# Patient Record
Sex: Female | Born: 2000 | Race: Black or African American | Hispanic: No | Marital: Single | Smoking: Former smoker
Health system: Southern US, Community
[De-identification: ages and names within clinical notes are randomized; demographics above are authoritative.]

## PROBLEM LIST (undated history)

## (undated) DIAGNOSIS — J45909 Unspecified asthma, uncomplicated: Secondary | ICD-10-CM

## (undated) DIAGNOSIS — N730 Acute parametritis and pelvic cellulitis: Secondary | ICD-10-CM

## (undated) DIAGNOSIS — Z789 Other specified health status: Secondary | ICD-10-CM

## (undated) HISTORY — DX: Unspecified asthma, uncomplicated: J45.909

## (undated) HISTORY — PX: NO PAST SURGERIES: SHX2092

---
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
## 2020-03-12 ENCOUNTER — Emergency Department (HOSPITAL_COMMUNITY)
Admission: EM | Admit: 2020-03-12 | Discharge: 2020-03-12 | Disposition: A | Discharge status: Home / Self Care | Payer: Medicaid Other | Attending: Emergency Medicine | Admitting: Emergency Medicine

## 2020-03-12 ENCOUNTER — Other Ambulatory Visit: Payer: Self-pay

## 2020-03-12 ENCOUNTER — Encounter (HOSPITAL_COMMUNITY): Payer: Self-pay | Admitting: *Deleted

## 2020-03-12 DIAGNOSIS — Z5321 Procedure and treatment not carried out due to patient leaving prior to being seen by health care provider: Secondary | ICD-10-CM | POA: Insufficient documentation

## 2020-03-12 DIAGNOSIS — R1084 Generalized abdominal pain: Secondary | ICD-10-CM | POA: Insufficient documentation

## 2020-03-12 DIAGNOSIS — K5900 Constipation, unspecified: Secondary | ICD-10-CM | POA: Insufficient documentation

## 2020-03-12 LAB — COMPREHENSIVE METABOLIC PANEL
ALT: 39 U/L (ref 0–44)
AST: 43 U/L — ABNORMAL HIGH (ref 15–41)
Albumin: 3.4 g/dL — ABNORMAL LOW (ref 3.5–5.0)
Alkaline Phosphatase: 74 U/L (ref 38–126)
Anion gap: 9 (ref 5–15)
BUN: 9 mg/dL (ref 6–20)
CO2: 24 mmol/L (ref 22–32)
Calcium: 9.2 mg/dL (ref 8.9–10.3)
Chloride: 106 mmol/L (ref 98–111)
Creatinine, Ser: 0.79 mg/dL (ref 0.44–1.00)
GFR calc Af Amer: >60 mL/min (ref >60)
GFR calc non Af Amer: >60 mL/min (ref >60)
Glucose, Bld: 101 mg/dL — ABNORMAL HIGH (ref 70–99)
Potassium: 4 mmol/L (ref 3.5–5.1)
Sodium: 139 mmol/L (ref 135–145)
Total Bilirubin: 0.3 mg/dL (ref 0.3–1.2)
Total Protein: 6.7 g/dL (ref 6.5–8.1)

## 2020-03-12 LAB — CBC
HCT: 36.8 % (ref 36.0–46.0)
Hemoglobin: 11.6 g/dL — ABNORMAL LOW (ref 12.0–15.0)
MCH: 28.2 pg (ref 26.0–34.0)
MCHC: 31.5 g/dL (ref 30.0–36.0)
MCV: 89.5 fL (ref 80.0–100.0)
Platelets: 293 10*3/uL (ref 150–400)
RBC: 4.11 MIL/uL (ref 3.87–5.11)
RDW: 13 % (ref 11.5–15.5)
WBC: 7.8 10*3/uL (ref 4.0–10.5)
nRBC: 0 % (ref 0.0–0.2)

## 2020-03-12 LAB — URINALYSIS, ROUTINE W REFLEX MICROSCOPIC
Bilirubin Urine: NEGATIVE
Glucose, UA: NEGATIVE mg/dL
Hgb urine dipstick: NEGATIVE
Ketones, ur: NEGATIVE mg/dL
Leukocytes,Ua: NEGATIVE
Nitrite: NEGATIVE
Protein, ur: NEGATIVE mg/dL
Specific Gravity, Urine: 1.02 (ref 1.005–1.030)
pH: 6 (ref 5.0–8.0)

## 2020-03-12 LAB — I-STAT BETA HCG BLOOD, ED (MC, WL, AP ONLY): I-stat hCG, quantitative: <5 m[IU]/mL (ref <5)

## 2020-03-12 LAB — LIPASE, BLOOD: Lipase: 24 U/L (ref 11–51)

## 2020-03-12 NOTE — ED Triage Notes (Signed)
Pt is here with one week of generalized abdominal and states some constipation.  Denies vomiting or diarrhea.  LMP 7/28. Pt states cycle is off and on.  No vaginal discharge or dysuria.

## 2020-03-14 ENCOUNTER — Inpatient Hospital Stay (HOSPITAL_COMMUNITY)
Admission: EM | Admit: 2020-03-14 | Discharge: 2020-03-17 | DRG: 759 | Disposition: A | Discharge status: Home / Self Care | Payer: Medicaid Other | Attending: Obstetrics and Gynecology | Admitting: Obstetrics and Gynecology

## 2020-03-14 ENCOUNTER — Encounter: Payer: Self-pay | Admitting: Obstetrics and Gynecology

## 2020-03-14 ENCOUNTER — Emergency Department (HOSPITAL_COMMUNITY): Payer: Medicaid Other

## 2020-03-14 ENCOUNTER — Other Ambulatory Visit: Payer: Self-pay

## 2020-03-14 DIAGNOSIS — Z79899 Other long term (current) drug therapy: Secondary | ICD-10-CM

## 2020-03-14 DIAGNOSIS — N83202 Unspecified ovarian cyst, left side: Secondary | ICD-10-CM | POA: Diagnosis present

## 2020-03-14 DIAGNOSIS — R109 Unspecified abdominal pain: Secondary | ICD-10-CM | POA: Diagnosis not present

## 2020-03-14 DIAGNOSIS — N739 Female pelvic inflammatory disease, unspecified: Principal | ICD-10-CM | POA: Diagnosis present

## 2020-03-14 DIAGNOSIS — Z791 Long term (current) use of non-steroidal anti-inflammatories (NSAID): Secondary | ICD-10-CM

## 2020-03-14 DIAGNOSIS — Z20822 Contact with and (suspected) exposure to covid-19: Secondary | ICD-10-CM | POA: Diagnosis present

## 2020-03-14 DIAGNOSIS — R102 Pelvic and perineal pain: Secondary | ICD-10-CM

## 2020-03-14 DIAGNOSIS — K529 Noninfective gastroenteritis and colitis, unspecified: Secondary | ICD-10-CM | POA: Diagnosis present

## 2020-03-14 LAB — COMPREHENSIVE METABOLIC PANEL
ALT: 23 U/L (ref 0–44)
AST: 16 U/L (ref 15–41)
Albumin: 3.3 g/dL — ABNORMAL LOW (ref 3.5–5.0)
Alkaline Phosphatase: 77 U/L (ref 38–126)
Anion gap: 11 (ref 5–15)
BUN: 7 mg/dL (ref 6–20)
CO2: 22 mmol/L (ref 22–32)
Calcium: 9.2 mg/dL (ref 8.9–10.3)
Chloride: 102 mmol/L (ref 98–111)
Creatinine, Ser: 0.7 mg/dL (ref 0.44–1.00)
GFR calc Af Amer: >60 mL/min (ref >60)
GFR calc non Af Amer: >60 mL/min (ref >60)
Glucose, Bld: 112 mg/dL — ABNORMAL HIGH (ref 70–99)
Potassium: 3.5 mmol/L (ref 3.5–5.1)
Sodium: 135 mmol/L (ref 135–145)
Total Bilirubin: 0.5 mg/dL (ref 0.3–1.2)
Total Protein: 7 g/dL (ref 6.5–8.1)

## 2020-03-14 LAB — URINALYSIS, ROUTINE W REFLEX MICROSCOPIC
Bacteria, UA: NONE SEEN
Bilirubin Urine: NEGATIVE
Glucose, UA: NEGATIVE mg/dL
Ketones, ur: NEGATIVE mg/dL
Leukocytes,Ua: NEGATIVE
Nitrite: NEGATIVE
Protein, ur: NEGATIVE mg/dL
Specific Gravity, Urine: 1.02 (ref 1.005–1.030)
pH: 5 (ref 5.0–8.0)

## 2020-03-14 LAB — CBC
HCT: 37.1 % (ref 36.0–46.0)
Hemoglobin: 12.2 g/dL (ref 12.0–15.0)
MCH: 28.7 pg (ref 26.0–34.0)
MCHC: 32.9 g/dL (ref 30.0–36.0)
MCV: 87.3 fL (ref 80.0–100.0)
Platelets: 346 10*3/uL (ref 150–400)
RBC: 4.25 MIL/uL (ref 3.87–5.11)
RDW: 13 % (ref 11.5–15.5)
WBC: 11.3 10*3/uL — ABNORMAL HIGH (ref 4.0–10.5)
nRBC: 0 % (ref 0.0–0.2)

## 2020-03-14 LAB — WET PREP, GENITAL
Clue Cells Wet Prep HPF POC: NONE SEEN
Sperm: NONE SEEN
Trich, Wet Prep: NONE SEEN
Yeast Wet Prep HPF POC: NONE SEEN

## 2020-03-14 LAB — I-STAT BETA HCG BLOOD, ED (MC, WL, AP ONLY): I-stat hCG, quantitative: <5 m[IU]/mL (ref <5)

## 2020-03-14 LAB — LIPASE, BLOOD: Lipase: 21 U/L (ref 11–51)

## 2020-03-14 MED ORDER — FENTANYL CITRATE (PF) 100 MCG/2ML IJ SOLN
25.0000 ug | Freq: Once | INTRAMUSCULAR | Status: AC
  Administered 2020-03-14: 25 ug via INTRAVENOUS
  Filled 2020-03-14: qty 2

## 2020-03-14 MED ORDER — DOXYCYCLINE HYCLATE 100 MG PO CAPS: 100.0000 mg | ORAL_CAPSULE | Freq: Two times a day (BID) | ORAL | 0 refills | Status: AC

## 2020-03-14 MED ORDER — IOHEXOL 300 MG/ML  SOLN
100.0000 mL | Freq: Once | INTRAMUSCULAR | Status: AC | PRN
  Administered 2020-03-14: 100 mL via INTRAVENOUS

## 2020-03-14 MED ORDER — IBUPROFEN 200 MG PO TABS: 800.0000 mg | ORAL_TABLET | Freq: Three times a day (TID) | ORAL | Status: DC | PRN

## 2020-03-14 MED ORDER — CEFTRIAXONE SODIUM 500 MG IJ SOLR
500.0000 mg | Freq: Once | INTRAMUSCULAR | Status: AC
  Administered 2020-03-14: 500 mg via INTRAMUSCULAR
  Filled 2020-03-14: qty 500

## 2020-03-14 MED ORDER — DOCUSATE SODIUM 100 MG PO CAPS
100.0000 mg | ORAL_CAPSULE | Freq: Two times a day (BID) | ORAL | Status: DC
  Administered 2020-03-15 – 2020-03-17 (×4): 100 mg via ORAL
  Filled 2020-03-14 (×5): qty 1

## 2020-03-14 MED ORDER — ZOLPIDEM TARTRATE 5 MG PO TABS: 5.0000 mg | ORAL_TABLET | Freq: Every evening | ORAL | Status: DC | PRN

## 2020-03-14 MED ORDER — ONDANSETRON HCL 4 MG PO TABS
4.0000 mg | ORAL_TABLET | Freq: Four times a day (QID) | ORAL | Status: DC | PRN
  Administered 2020-03-15: 4 mg via ORAL
  Filled 2020-03-14: qty 1

## 2020-03-14 MED ORDER — SODIUM CHLORIDE 0.9 % IV SOLN
2.0000 g | Freq: Four times a day (QID) | INTRAVENOUS | Status: DC
  Administered 2020-03-15 – 2020-03-17 (×11): 2 g via INTRAVENOUS
  Filled 2020-03-14 (×14): qty 2

## 2020-03-14 MED ORDER — DOXYCYCLINE HYCLATE 100 MG PO TABS
100.0000 mg | ORAL_TABLET | Freq: Two times a day (BID) | ORAL | Status: DC
  Administered 2020-03-15 – 2020-03-17 (×6): 100 mg via ORAL
  Filled 2020-03-14 (×6): qty 1

## 2020-03-14 MED ORDER — SODIUM CHLORIDE 0.9 % IV BOLUS
1000.0000 mL | Freq: Once | INTRAVENOUS | Status: AC
  Administered 2020-03-14: 1000 mL via INTRAVENOUS

## 2020-03-14 MED ORDER — KETOROLAC TROMETHAMINE 60 MG/2ML IM SOLN
60.0000 mg | Freq: Once | INTRAMUSCULAR | Status: DC
  Filled 2020-03-14: qty 2

## 2020-03-14 MED ORDER — DEXTROSE IN LACTATED RINGERS 5 % IV SOLN: INTRAVENOUS | Status: DC

## 2020-03-14 MED ORDER — OXYCODONE-ACETAMINOPHEN 5-325 MG PO TABS
1.0000 | ORAL_TABLET | ORAL | Status: DC | PRN
  Administered 2020-03-15 (×2): 2 via ORAL
  Administered 2020-03-15 – 2020-03-16 (×2): 1 via ORAL
  Filled 2020-03-14: qty 2
  Filled 2020-03-14: qty 1
  Filled 2020-03-14: qty 2
  Filled 2020-03-14: qty 1

## 2020-03-14 MED ORDER — ACETAMINOPHEN 325 MG PO TABS
650.0000 mg | ORAL_TABLET | Freq: Once | ORAL | Status: AC
  Administered 2020-03-14: 650 mg via ORAL
  Filled 2020-03-14: qty 2

## 2020-03-14 MED ORDER — DOXYCYCLINE HYCLATE 100 MG PO TABS
100.0000 mg | ORAL_TABLET | Freq: Once | ORAL | Status: AC
  Administered 2020-03-14: 100 mg via ORAL
  Filled 2020-03-14: qty 1

## 2020-03-14 MED ORDER — ONDANSETRON HCL 4 MG/2ML IJ SOLN
4.0000 mg | Freq: Four times a day (QID) | INTRAMUSCULAR | Status: DC | PRN
  Administered 2020-03-16: 4 mg via INTRAVENOUS
  Filled 2020-03-14: qty 2

## 2020-03-14 NOTE — ED Triage Notes (Signed)
Pt reports 2 weeks of generalized intermittent abdominal pain. Endorses nausea but denies vomiting or diarrhea. Reports spotting, although she is not supposed to get her period until the end of the month.

## 2020-03-14 NOTE — H&P (Signed)
[facility] Attending History and Physical  Whitney Hickman is a 19 y.o. G1P1001 SVD x 1 who presented to the ED today for evaluation of abdominal pain which she has had for 2 weeks.  Pt states the pain is sharp and continuous.  She denies nausea and vomiting.  Positive chills are noted.  Pt had a tmax of 102 while in the ED.  She also denies dysuria, constipation or diarrhea.  Pt denies history of gonorrhea or chlamydia.  Of note pt states she generally has unprotected intercourse with her partner.    Past Medical History:  Diagnosis Date  . Medical history non-contributory    Past Surgical History:  Procedure Laterality Date  . NO PAST SURGERIES     OB History  Gravida Para Term Preterm AB Living  1 1 1     1   SAB TAB Ectopic Multiple Live Births        0 1    # Outcome Date GA Lbr Len/2nd Weight Sex Delivery Anes PTL Lv  1 Term 11/25/16 [redacted]w[redacted]d 05:47 / 00:50 2741 g F Vag-Spont EPI  LIV  Patient denies any other pertinent gynecologic issues.  No current facility-administered medications on file prior to encounter.   Current Outpatient Medications on File Prior to Encounter  Medication Sig Dispense Refill  . ibuprofen (ADVIL,MOTRIN) 600 MG tablet Take 1 tablet (600 mg total) by mouth every 6 (six) hours. 30 tablet 0  . Prenatal Vit-Fe Fumarate-FA (PREPLUS) 27-1 MG TABS Take 1 tablet by mouth daily.  1   No Known Allergies  Social History:   reports that she has never smoked. She has never used smokeless tobacco. She reports previous alcohol use. She reports previous drug use. No family history on file.  Review of Systems: Pertinent items noted in HPI and remainder of comprehensive ROS otherwise negative.  PHYSICAL EXAM: Blood pressure (!) 100/53, pulse 96, temperature (!) 102 F (38.9 C), temperature source Oral, resp. rate 16, height 5\' 3"  (1.6 m), weight 54.4 kg, last menstrual period 02/21/2020, SpO2 98 %, unknown if currently  breastfeeding. CONSTITUTIONAL: Well-developed, well-nourished female in no acute distress.  HENT:  Normocephalic, atraumatic, External right and left ear normal. Oropharynx is clear and moist EYES: Conjunctivae and EOM are normal. No scleral icterus.  NECK: Normal range of motion, supple, no masses SKIN: Skin is warm and dry. No rash noted. Not diaphoretic. No erythema. No pallor. NEUROLOGIC: Alert and oriented to person, place, and time. Normal reflexes, muscle tone coordination. No cranial nerve deficit noted. PSYCHIATRIC: Normal mood and affect. Normal behavior. Normal judgment and thought content. CARDIOVASCULAR: Normal heart rate noted, regular rhythm RESPIRATORY: Effort and breath sounds normal, no problems with respiration noted ABDOMEN:tender in all 4 quadrants.  Pain is the worst in the LLQ.  Some guarding noted. PELVIC: Normal appearing external genitalia; normal appearing vaginal mucosa and cervix.  Tampon removed.    Normal appearing discharge.  Normal uterine size, no other palpable masses, cervical motion tenderness noted.  left adnexal pain noted. MUSCULOSKELETAL: Normal range of motion. No tenderness.  No cyanosis, clubbing, or edema.  2+ distal pulses.  Labs: Results for orders placed or performed during the hospital encounter of 03/14/20 (from the past 336 hour(s))  Lipase, blood   Collection Time: 03/14/20  1:28 PM  Result Value Ref Range   Lipase 21 11 - 51 U/L  Comprehensive metabolic panel   Collection Time: 03/14/20  1:28 PM  Result Value Ref Range  Sodium 135 135 - 145 mmol/L   Potassium 3.5 3.5 - 5.1 mmol/L   Chloride 102 98 - 111 mmol/L   CO2 22 22 - 32 mmol/L   Glucose, Bld 112 (H) 70 - 99 mg/dL   BUN 7 6 - 20 mg/dL   Creatinine, Ser 2.42 0.44 - 1.00 mg/dL   Calcium 9.2 8.9 - 35.3 mg/dL   Total Protein 7.0 6.5 - 8.1 g/dL   Albumin 3.3 (L) 3.5 - 5.0 g/dL   AST 16 15 - 41 U/L   ALT 23 0 - 44 U/L   Alkaline Phosphatase 77 38 - 126 U/L   Total Bilirubin 0.5  0.3 - 1.2 mg/dL   GFR calc non Af Amer >60 >60 mL/min   GFR calc Af Amer >60 >60 mL/min   Anion gap 11 5 - 15  CBC   Collection Time: 03/14/20  1:28 PM  Result Value Ref Range   WBC 11.3 (H) 4.0 - 10.5 K/uL   RBC 4.25 3.87 - 5.11 MIL/uL   Hemoglobin 12.2 12.0 - 15.0 g/dL   HCT 61.4 36 - 46 %   MCV 87.3 80.0 - 100.0 fL   MCH 28.7 26.0 - 34.0 pg   MCHC 32.9 30.0 - 36.0 g/dL   RDW 43.1 54.0 - 08.6 %   Platelets 346 150 - 400 K/uL   nRBC 0.0 0.0 - 0.2 %  I-Stat beta hCG blood, ED   Collection Time: 03/14/20  2:03 PM  Result Value Ref Range   I-stat hCG, quantitative <5.0 <5 mIU/mL   Comment 3          Urinalysis, Routine w reflex microscopic Urine, Clean Catch   Collection Time: 03/14/20  4:00 PM  Result Value Ref Range   Color, Urine YELLOW YELLOW   APPearance CLEAR CLEAR   Specific Gravity, Urine 1.020 1.005 - 1.030   pH 5.0 5.0 - 8.0   Glucose, UA NEGATIVE NEGATIVE mg/dL   Hgb urine dipstick SMALL (A) NEGATIVE   Bilirubin Urine NEGATIVE NEGATIVE   Ketones, ur NEGATIVE NEGATIVE mg/dL   Protein, ur NEGATIVE NEGATIVE mg/dL   Nitrite NEGATIVE NEGATIVE   Leukocytes,Ua NEGATIVE NEGATIVE   RBC / HPF 6-10 0 - 5 RBC/hpf   WBC, UA 0-5 0 - 5 WBC/hpf   Bacteria, UA NONE SEEN NONE SEEN   Squamous Epithelial / LPF 0-5 0 - 5   Mucus PRESENT   Wet prep, genital   Collection Time: 03/14/20  4:45 PM   Specimen: Vaginal  Result Value Ref Range   Yeast Wet Prep HPF POC NONE SEEN NONE SEEN   Trich, Wet Prep NONE SEEN NONE SEEN   Clue Cells Wet Prep HPF POC NONE SEEN NONE SEEN   WBC, Wet Prep HPF POC MANY (A) NONE SEEN   Sperm NONE SEEN   Results for orders placed or performed during the hospital encounter of 03/12/20 (from the past 336 hour(s))  Lipase, blood   Collection Time: 03/12/20  7:30 AM  Result Value Ref Range   Lipase 24 11 - 51 U/L  Comprehensive metabolic panel   Collection Time: 03/12/20  7:30 AM  Result Value Ref Range   Sodium 139 135 - 145 mmol/L   Potassium 4.0  3.5 - 5.1 mmol/L   Chloride 106 98 - 111 mmol/L   CO2 24 22 - 32 mmol/L   Glucose, Bld 101 (H) 70 - 99 mg/dL   BUN 9 6 - 20 mg/dL   Creatinine, Ser  0.79 0.44 - 1.00 mg/dL   Calcium 9.2 8.9 - 49.8 mg/dL   Total Protein 6.7 6.5 - 8.1 g/dL   Albumin 3.4 (L) 3.5 - 5.0 g/dL   AST 43 (H) 15 - 41 U/L   ALT 39 0 - 44 U/L   Alkaline Phosphatase 74 38 - 126 U/L   Total Bilirubin 0.3 0.3 - 1.2 mg/dL   GFR calc non Af Amer >60 >60 mL/min   GFR calc Af Amer >60 >60 mL/min   Anion gap 9 5 - 15  CBC   Collection Time: 03/12/20  7:30 AM  Result Value Ref Range   WBC 7.8 4.0 - 10.5 K/uL   RBC 4.11 3.87 - 5.11 MIL/uL   Hemoglobin 11.6 (L) 12.0 - 15.0 g/dL   HCT 26.4 36 - 46 %   MCV 89.5 80.0 - 100.0 fL   MCH 28.2 26.0 - 34.0 pg   MCHC 31.5 30.0 - 36.0 g/dL   RDW 15.8 30.9 - 40.7 %   Platelets 293 150 - 400 K/uL   nRBC 0.0 0.0 - 0.2 %  I-Stat beta hCG blood, ED   Collection Time: 03/12/20  7:39 AM  Result Value Ref Range   I-stat hCG, quantitative <5.0 <5 mIU/mL   Comment 3          Urinalysis, Routine w reflex microscopic Urine, Clean Catch   Collection Time: 03/12/20  7:46 AM  Result Value Ref Range   Color, Urine YELLOW YELLOW   APPearance CLEAR CLEAR   Specific Gravity, Urine 1.020 1.005 - 1.030   pH 6.0 5.0 - 8.0   Glucose, UA NEGATIVE NEGATIVE mg/dL   Hgb urine dipstick NEGATIVE NEGATIVE   Bilirubin Urine NEGATIVE NEGATIVE   Ketones, ur NEGATIVE NEGATIVE mg/dL   Protein, ur NEGATIVE NEGATIVE mg/dL   Nitrite NEGATIVE NEGATIVE   Leukocytes,Ua NEGATIVE NEGATIVE    Imaging Studies: CT ABDOMEN PELVIS W CONTRAST  Result Date: 03/14/2020 CLINICAL DATA:  19 year old female with abdominal pain. EXAM: CT ABDOMEN AND PELVIS WITH CONTRAST TECHNIQUE: Multidetector CT imaging of the abdomen and pelvis was performed using the standard protocol following bolus administration of intravenous contrast. CONTRAST:  OMNIPAQUE IOHEXOL 300 MG/ML  SOLN COMPARISON:  Pelvic ultrasound dated  03/14/2020. FINDINGS: Lower chest: The visualized lung bases are clear. No intra-abdominal free air or free fluid. Hepatobiliary: No focal liver abnormality is seen. No gallstones, gallbladder wall thickening, or biliary dilatation. Pancreas: Unremarkable. No pancreatic ductal dilatation or surrounding inflammatory changes. Spleen: Normal in size without focal abnormality. Adrenals/Urinary Tract: The adrenal glands are unremarkable. The kidneys, and visualized ureters appear unremarkable. The urinary bladder is collapsed. Stomach/Bowel: There is diffuse mucosal thickening and enhancement of the small bowel consistent with enteritis. Clinical correlation is recommended. There is no bowel obstruction. The appendix is normal. Vascular/Lymphatic: The abdominal aorta and IVC are unremarkable. No portal venous gas. There is no adenopathy. Reproductive: The uterus is anteverted and grossly unremarkable. There is a 3 cm left ovarian corpus luteum. There is an enhancing tissue in the right hemipelvis anteriorly, likely vascular pedicle of the right ovary. Other: There is diffuse pelvic edema. Musculoskeletal: No acute or significant osseous findings. IMPRESSION: 1. Enteritis. Clinical correlation is recommended. No bowel obstruction. Normal appendix. 2. A 3 cm left ovarian corpus luteum. Electronically Signed   By: Elgie Collard M.D.   On: 03/14/2020 19:25   US PELVIC COMPLETE W TRANSVAGINAL AND TORSION R/O  Result Date: 03/14/2020 CLINICAL DATA:  Adnexal pain. EXAM: TRANSABDOMINAL  AND TRANSVAGINAL ULTRASOUND OF PELVIS DOPPLER ULTRASOUND OF OVARIES TECHNIQUE: Both transabdominal and transvaginal ultrasound examinations of the pelvis were performed. Transabdominal technique was performed for global imaging of the pelvis including uterus, ovaries, adnexal regions, and pelvic cul-de-sac. It was necessary to proceed with endovaginal exam following the transabdominal exam to visualize the ovaries and adnexa. Color and  duplex Doppler ultrasound was utilized to evaluate blood flow to the ovaries. COMPARISON:  Concurrent abdominal CT, available at time of ultrasound assessment FINDINGS: Uterus Measurements: 8.8 x 4.3 x 5.1 cm = volume: 101 mL. No fibroids or other mass visualized. Endometrium Thickness: 10 mm, normal.  No focal abnormality visualized. Right ovary Measurements: 4.8 x 2.5 x 2.9 cm = volume: 18.1 mL. Multiple follicles. Arterial and venous flow is seen with question of ovarian hypervascularity. No adnexal mass. Left ovary Measurements: 5.5 x 3.6 x 4.7 cm = volume: 48 mL. There is increased ovarian blood flow. Heterogeneous complex area in the left ovary measuring approximately 3.6 x 3.3 x 3.2 cm has peripheral but no internal flow. Patient was exquisitely tender in the left lower quadrant which limited assessment. Pulsed Doppler evaluation of both ovaries demonstrates normal low-resistance arterial and venous waveforms. Other findings Small to moderate volume of complex heterogeneous free fluid in the pelvis. IMPRESSION: 1. Slight increase bilateral adnexal/ovarian blood flow with complex free fluid in the pelvis. Differential considerations include pelvic inflammatory disease or pelvic congestion syndrome with a concurrent hemorrhagic cyst on the left, described below. 2. Complex heterogeneous 3.6 x 3.3 x 3.2 cm structure in the left ovary with peripheral but no internal vascularity. This may represent a hemorrhagic cyst. The possibility of solid lesion or less likely tubo-ovarian abscess are considered. Short-interval follow up ultrasound in 6-12 weeks is recommended, preferably during the week following the patient's normal menses. 3. Normal sonographic appearance of the uterus. 4. No ovarian torsion. Electronically Signed   By: Narda RutherfordMelanie  Sanford M.D.   On: 03/14/2020 19:27    Assessment: Active Problems:   Pelvic inflammatory disease   Pelvic inflammatory disease (PID)   Plan: Admit to [facility] Continue oral doxycycline BID, IV cefoxitin q 6 hours. Diet as tolerated.  Recheck labs in am Anticipate 24-48 hour hospital stay until pt afebrile 24 hours and pelvic pain improvement Routine floor care    Mariel AloeLawrence Anjolina Byrer, MD, FACOG Obstetrician & Gynecologist, [facility], [facility]

## 2020-03-14 NOTE — ED Notes (Signed)
Admit md states that pt can eat as tolerated. Pt given a bag lunch. Tolerated well.

## 2020-03-14 NOTE — ED Notes (Signed)
OB/GYN md to see pt. Pelvic exam done by md.

## 2020-03-14 NOTE — ED Provider Notes (Signed)
[facility] Provider Note   CSN: [id number] Arrival date & time: 03/14/20  1214     History Chief Complaint  Patient presents with  . Abdominal Pain    Wynelle LinkJamaya Hickman is a 19 y.o. female.  HPI 19 year old female with no significant medical history presents to the ER for 2 weeks of intermittent generalized abdominal pain, cramping, started period 2 weeks early.  He cannot localize the abdominal pain, states that when she presses on her legs that she will feel pain in the other.  She states that she has been having some painful bowel movements, however stool has not been hard.  Denies any hematochezia.  She states that she is sexually active with one partner and they do not use the barrier method.  Patient states her periods are pretty regular, however around the time of this abdominal pain started she also started to have some spotting and early evidence of a period. She will sometimes get nauseous but denies any vomiting. Endorses chills but no measurable fevers. No cough, SOB, cp.  Reports good appetite. Denies any vaginal discharge or odors.      Past Medical History:  Diagnosis Date  . Medical history non-contributory     Patient Active Problem List   Diagnosis Date Noted  . Pelvic inflammatory disease 03/14/2020  . Amniotic fluid leaking 11/24/2016    Past Surgical History:  Procedure Laterality Date  . NO PAST SURGERIES       OB History    Gravida  1   Para  1   Term  1   Preterm      AB      Living  1     SAB      TAB      Ectopic      Multiple  0   Live Births  1           No family history on file.  Social History   Tobacco Use  . Smoking status: Never Smoker  . Smokeless tobacco: Never Used  Substance Use Topics  . Alcohol use: Not Currently  . Drug use: Not Currently    Home Medications Prior to Admission medications   Medication Sig Start Date End Date Taking? Authorizing Provider    doxycycline (VIBRAMYCIN) 100 MG capsule Take 1 capsule (100 mg total) by mouth 2 (two) times daily for 14 days. 03/14/20 03/28/20  Mare FerrariBelaya, Sorin Frimpong A, PA-C  ibuprofen (ADVIL,MOTRIN) 600 MG tablet Take 1 tablet (600 mg total) by mouth every 6 (six) hours. 11/27/16   Aviva SignsWilliams, Marie L, CNM  Prenatal Vit-Fe Fumarate-FA (PREPLUS) 27-1 MG TABS Take 1 tablet by mouth daily. 10/25/16   [provider]    Allergies    Patient has no known allergies.  Review of Systems   Review of Systems  Constitutional: Negative for chills and fever.  HENT: Negative for ear pain and sore throat.   Eyes: Negative for pain and visual disturbance.  Respiratory: Negative for cough and shortness of breath.   Cardiovascular: Negative for chest pain and palpitations.  Gastrointestinal: Positive for abdominal pain and nausea. Negative for vomiting.  Genitourinary: Positive for pelvic pain and vaginal bleeding. Negative for dysuria, hematuria and vaginal discharge.  Musculoskeletal: Negative for arthralgias and back pain.  Skin: Negative for color change and rash.  Neurological: Negative for seizures, syncope and headaches.  All other systems reviewed and are negative.   Physical Exam Updated Vital Signs BP Marland Kitchen(!)  100/53 (BP Location: Right Arm)   Pulse 96   Temp (!) 102 F (38.9 C) (Oral)   Resp 16   Ht 5\' 3"  (1.6 m)   Wt 54.4 kg   LMP 02/21/2020 (Approximate)   SpO2 98%   BMI 21.26 kg/m   Physical Exam Vitals and nursing note reviewed. Exam conducted with a chaperone present 02/23/2020 RN).  Constitutional:      General: She is not in acute distress.    Appearance: She is well-developed. She is not ill-appearing, toxic-appearing or diaphoretic.  HENT:     Head: Normocephalic and atraumatic.  Eyes:     Conjunctiva/sclera: Conjunctivae normal.  Cardiovascular:     Rate and Rhythm: Normal rate and regular rhythm.     Heart sounds: No murmur heard.   Pulmonary:     Effort: Pulmonary effort is  normal. No respiratory distress.     Breath sounds: Normal breath sounds.  Abdominal:     Palpations: Abdomen is soft.     Tenderness: There is generalized abdominal tenderness.     Comments: Patient is very hyperesthetic to touch, barely allowing me to palpate her abdomen stating that she will feel extreme pain upon palpation  Genitourinary:    General: Normal vulva.     Exam position: Supine.     Vagina: Bleeding present.     Cervix: Cervical motion tenderness and cervical bleeding present. No discharge, friability or erythema.     Uterus: Normal.      Adnexa:        Right: Tenderness present.        Left: Tenderness present.      Rectum: No external hemorrhoid.     Comments: All quadrant blood in the vaginal vault. Positive CMT and bilateral adnexal tenderness Musculoskeletal:     Cervical back: Neck supple.  Skin:    General: Skin is warm and dry.  Neurological:     Mental Status: She is alert.     ED Results / Procedures / Treatments   Labs (all labs ordered are listed, but only abnormal results are displayed) Labs Reviewed  WET PREP, GENITAL - Abnormal; Notable for the following components:      Result Value   WBC, Wet Prep HPF POC MANY (*)    All other components within normal limits  COMPREHENSIVE METABOLIC PANEL - Abnormal; Notable for the following components:   Glucose, Bld 112 (*)    Albumin 3.3 (*)    All other components within normal limits  CBC - Abnormal; Notable for the following components:   WBC 11.3 (*)    All other components within normal limits  URINALYSIS, ROUTINE W REFLEX MICROSCOPIC - Abnormal; Notable for the following components:   Hgb urine dipstick SMALL (*)    All other components within normal limits  SARS CORONAVIRUS 2 BY RT PCR (HOSPITAL ORDER, PERFORMED IN [facility])  LIPASE, BLOOD  I-STAT BETA HCG BLOOD, ED ([facility], [facility], [facility] ONLY)  GC/CHLAMYDIA PROBE AMP ([place]) NOT AT [facility]    EKG None  Radiology CT ABDOMEN  PELVIS W CONTRAST  Result Date: 03/14/2020 CLINICAL DATA:  19 year old female with abdominal pain. EXAM: CT ABDOMEN AND PELVIS WITH CONTRAST TECHNIQUE: Multidetector CT imaging of the abdomen and pelvis was performed using the standard protocol following bolus administration of intravenous contrast. CONTRAST:  12 OMNIPAQUE IOHEXOL 300 MG/ML  SOLN COMPARISON:  Pelvic ultrasound dated 03/14/2020. FINDINGS: Lower chest: The visualized lung bases are clear. No intra-abdominal free  air or free fluid. Hepatobiliary: No focal liver abnormality is seen. No gallstones, gallbladder wall thickening, or biliary dilatation. Pancreas: Unremarkable. No pancreatic ductal dilatation or surrounding inflammatory changes. Spleen: Normal in size without focal abnormality. Adrenals/Urinary Tract: The adrenal glands are unremarkable. The kidneys, and visualized ureters appear unremarkable. The urinary bladder is collapsed. Stomach/Bowel: There is diffuse mucosal thickening and enhancement of the small bowel consistent with enteritis. Clinical correlation is recommended. There is no bowel obstruction. The appendix is normal. Vascular/Lymphatic: The abdominal aorta and IVC are unremarkable. No portal venous gas. There is no adenopathy. Reproductive: The uterus is anteverted and grossly unremarkable. There is a 3 cm left ovarian corpus luteum. There is an enhancing tissue in the right hemipelvis anteriorly, likely vascular pedicle of the right ovary. Other: There is diffuse pelvic edema. Musculoskeletal: No acute or significant osseous findings. IMPRESSION: 1. Enteritis. Clinical correlation is recommended. No bowel obstruction. Normal appendix. 2. A 3 cm left ovarian corpus luteum. Electronically Signed   By: Elgie Collard M.D.   On: 03/14/2020 19:25   US PELVIC COMPLETE W TRANSVAGINAL AND TORSION R/O  Result Date: 03/14/2020 CLINICAL DATA:  Adnexal pain. EXAM: TRANSABDOMINAL AND TRANSVAGINAL ULTRASOUND OF PELVIS DOPPLER  ULTRASOUND OF OVARIES TECHNIQUE: Both transabdominal and transvaginal ultrasound examinations of the pelvis were performed. Transabdominal technique was performed for global imaging of the pelvis including uterus, ovaries, adnexal regions, and pelvic cul-de-sac. It was necessary to proceed with endovaginal exam following the transabdominal exam to visualize the ovaries and adnexa. Color and duplex Doppler ultrasound was utilized to evaluate blood flow to the ovaries. COMPARISON:  Concurrent abdominal CT, available at time of ultrasound assessment FINDINGS: Uterus Measurements: 8.8 x 4.3 x 5.1 cm = volume: 101 mL. No fibroids or other mass visualized. Endometrium Thickness: 10 mm, normal.  No focal abnormality visualized. Right ovary Measurements: 4.8 x 2.5 x 2.9 cm = volume: 18.1 mL. Multiple follicles. Arterial and venous flow is seen with question of ovarian hypervascularity. No adnexal mass. Left ovary Measurements: 5.5 x 3.6 x 4.7 cm = volume: 48 mL. There is increased ovarian blood flow. Heterogeneous complex area in the left ovary measuring approximately 3.6 x 3.3 x 3.2 cm has peripheral but no internal flow. Patient was exquisitely tender in the left lower quadrant which limited assessment. Pulsed Doppler evaluation of both ovaries demonstrates normal low-resistance arterial and venous waveforms. Other findings Small to moderate volume of complex heterogeneous free fluid in the pelvis. IMPRESSION: 1. Slight increase bilateral adnexal/ovarian blood flow with complex free fluid in the pelvis. Differential considerations include pelvic inflammatory disease or pelvic congestion syndrome with a concurrent hemorrhagic cyst on the left, described below. 2. Complex heterogeneous 3.6 x 3.3 x 3.2 cm structure in the left ovary with peripheral but no internal vascularity. This may represent a hemorrhagic cyst. The possibility of solid lesion or less likely tubo-ovarian abscess are considered. Short-interval follow up  ultrasound in 6-12 weeks is recommended, preferably during the week following the patient's normal menses. 3. Normal sonographic appearance of the uterus. 4. No ovarian torsion. Electronically Signed   By: Narda Rutherford M.D.   On: 03/14/2020 19:27    Procedures Procedures (including critical care time)  Medications Ordered in ED Medications  ketorolac (TORADOL) injection 60 mg (60 mg Intramuscular Refused 03/14/20 1713)  acetaminophen (TYLENOL) tablet 650 mg (has no administration in time range)  sodium chloride 0.9 % bolus 1,000 mL (has no administration in time range)  fentaNYL (SUBLIMAZE) injection 25 mcg (has no administration  in time range)  cefTRIAXone (ROCEPHIN) injection 500 mg (500 mg Intramuscular Given 03/14/20 1716)  doxycycline (VIBRA-TABS) tablet 100 mg (100 mg Oral Given 03/14/20 1716)  iohexol (OMNIPAQUE) 300 MG/ML solution 100 mL (100 mLs Intravenous Contrast Given 03/14/20 1903)    ED Course  I have reviewed the triage vital signs and the nursing notes.  Pertinent labs & imaging results that were available during my care of the patient were reviewed by me and considered in my medical decision making (see chart for details).  Clinical Course as of Mar 14 2056  Thu Mar 14, 2020  2017 Donavan Foil    [MB]    Clinical Course User Index [MB] Leone Brand   MDM Rules/Calculators/A&P                         38:39 PM: 19 year old female with intermittent abdominal pain for the last 2 weeks On presentation, she is alert, oriented, nontoxic-appearing, in  no acute distress, resting comfortably in the ER bed.  Vitals with a borderline fever at 100, other vitals reassuring.  Physical exam with the patient being extremely hyperesthetic to touch, pain with any touch her abdomen stating that she will feel severe pain if I palpate.  DDX includes constipation, SBO, appendicits,  STD/PID, ovarian torsion, gallstones, PID, tubo-ovarian abscess, UTI, IBS   I personally reviewed and  interpreted her lab work  CMP without any significant electrode abnormalities, normal renal and liver function test.   CBC with mild leukocytosis of 11.3.  Normal hemoglobin. UA clear evidence of UTI, with small hemoglobin. Lipase normal Beta-hCG negative  Given the patient is significantly hyperesthetic to touch and is limiting my physical exam, will order CT abdomen pelvis.  We will also perform pelvic exam.  Will order Toradol for pain.  5:05PM:   Pelvic exam positive for CMT and bilateral adnexal tenderness. Given this, will order transvaginal ultrasound to rule out torsion and treat for PID with Rocephin and Doxy. Wet prep with many WBCs, negative for trich, clue cells or yeast. G/C chlamydia pending.   8:27 PM: Patient again febrile with a temperature of 102.  Treated with Tylenol.  Patient refused shot of Toradol as she had already gotten a shot of Rocephin and did not want another shot.  IV fentanyl was ordered. Started IV fluids. Her pelvic ultrasound showed increase in bilateral adnexal ovarian blood flow with complex free fluid in the pelvis suggestive of PID or pelvic congestion syndrome with concurrent hemorrhagic cyst on the left ( less likely tubo-ovarian abscess). CT abdomen with enteritis but no other acute intra-abdominal pathology. Pt does not endorse any vomiting. Pt continues to have pain on exam. Consulted Dr. Donavan Foil with OBGYN who recommends the patient be brought in for IV antibiotics.   DISPOSITION: Pt will be admitted for IV antibiotics for PID. Dr. Donavan Foil following. Remains hemodynamically stable.    Final Clinical Impression(s) / ED Diagnoses Final diagnoses:  Adnexal pain    Rx / DC Orders    Mare Ferrari, PA-C 03/14/20 2057    Pricilla Loveless, MD 03/15/20 1504

## 2020-03-15 ENCOUNTER — Other Ambulatory Visit: Payer: Self-pay

## 2020-03-15 ENCOUNTER — Encounter (HOSPITAL_COMMUNITY): Payer: Self-pay | Admitting: Obstetrics and Gynecology

## 2020-03-15 DIAGNOSIS — N739 Female pelvic inflammatory disease, unspecified: Principal | ICD-10-CM

## 2020-03-15 LAB — COMPREHENSIVE METABOLIC PANEL
ALT: 19 U/L (ref 0–44)
AST: 22 U/L (ref 15–41)
Albumin: 2.6 g/dL — ABNORMAL LOW (ref 3.5–5.0)
Alkaline Phosphatase: 65 U/L (ref 38–126)
Anion gap: 6 (ref 5–15)
BUN: <5 mg/dL — ABNORMAL LOW (ref 6–20)
CO2: 24 mmol/L (ref 22–32)
Calcium: 8.3 mg/dL — ABNORMAL LOW (ref 8.9–10.3)
Chloride: 104 mmol/L (ref 98–111)
Creatinine, Ser: 0.71 mg/dL (ref 0.44–1.00)
GFR calc Af Amer: >60 mL/min (ref >60)
GFR calc non Af Amer: >60 mL/min (ref >60)
Glucose, Bld: 142 mg/dL — ABNORMAL HIGH (ref 70–99)
Potassium: 3.1 mmol/L — ABNORMAL LOW (ref 3.5–5.1)
Sodium: 134 mmol/L — ABNORMAL LOW (ref 135–145)
Total Bilirubin: 0.4 mg/dL (ref 0.3–1.2)
Total Protein: 5.8 g/dL — ABNORMAL LOW (ref 6.5–8.1)

## 2020-03-15 LAB — CBC WITH DIFFERENTIAL/PLATELET
Abs Immature Granulocytes: 0.04 10*3/uL (ref 0.00–0.07)
Basophils Absolute: 0 10*3/uL (ref 0.0–0.1)
Basophils Relative: 0 %
Eosinophils Absolute: 0 10*3/uL (ref 0.0–0.5)
Eosinophils Relative: 0 %
HCT: 31.1 % — ABNORMAL LOW (ref 36.0–46.0)
Hemoglobin: 10 g/dL — ABNORMAL LOW (ref 12.0–15.0)
Immature Granulocytes: 1 %
Lymphocytes Relative: 17 %
Lymphs Abs: 1.4 10*3/uL (ref 0.7–4.0)
MCH: 28 pg (ref 26.0–34.0)
MCHC: 32.2 g/dL (ref 30.0–36.0)
MCV: 87.1 fL (ref 80.0–100.0)
Monocytes Absolute: 0.9 10*3/uL (ref 0.1–1.0)
Monocytes Relative: 11 %
Neutro Abs: 5.9 10*3/uL (ref 1.7–7.7)
Neutrophils Relative %: 71 %
Platelets: 290 10*3/uL (ref 150–400)
RBC: 3.57 MIL/uL — ABNORMAL LOW (ref 3.87–5.11)
RDW: 12.8 % (ref 11.5–15.5)
WBC: 8.3 10*3/uL (ref 4.0–10.5)
nRBC: 0 % (ref 0.0–0.2)

## 2020-03-15 LAB — GC/CHLAMYDIA PROBE AMP (~~LOC~~) NOT AT ARMC
Chlamydia: POSITIVE — AB
Comment: NEGATIVE
Comment: NORMAL
Neisseria Gonorrhea: NEGATIVE

## 2020-03-15 LAB — SARS CORONAVIRUS 2 BY RT PCR (HOSPITAL ORDER, PERFORMED IN ~~LOC~~ HOSPITAL LAB): SARS Coronavirus 2: NEGATIVE

## 2020-03-15 MED ORDER — POTASSIUM CHLORIDE CRYS ER 20 MEQ PO TBCR
20.0000 meq | EXTENDED_RELEASE_TABLET | Freq: Two times a day (BID) | ORAL | Status: AC
  Administered 2020-03-15 – 2020-03-16 (×4): 20 meq via ORAL
  Filled 2020-03-15 (×4): qty 1

## 2020-03-15 NOTE — Progress Notes (Signed)
This Clinical research associate has been informed that pt is having a low grade temp of 100.6.and c/o sore throat. No PRN tylenol ordered. Paged on call MD and received a return call approximated [phone]. And informed MD of above and v/s was rechecked and has a temp of 100.1. Per  MD just to hold of tylenol at this time, and ok to gvie to give prn percocet.  Pt remains alert/oriented in no apparent distress. Closely monitored.

## 2020-03-15 NOTE — Progress Notes (Signed)
HD # 1 PID Subjective: Patient reports still having abd pain, more LLQ. Tolerating diet. No N/V. Voiding without problems  Objective: AF VSS  Alert  Lungs clear Heart RRR Abd soft + BS diffuse abd tenderness no rebound or guarding Ext non tender   Assessment/Plan: Stable. AF now. Still with pain. Continue with antibiotics for 48-72 hrs.  LOS: 1 day    Hermina Staggers 03/15/2020, 11:42 AM

## 2020-03-15 NOTE — Plan of Care (Signed)

## 2020-03-16 DIAGNOSIS — N83202 Unspecified ovarian cyst, left side: Secondary | ICD-10-CM

## 2020-03-16 LAB — BASIC METABOLIC PANEL
Anion gap: 8 (ref 5–15)
BUN: <5 mg/dL — ABNORMAL LOW (ref 6–20)
CO2: 27 mmol/L (ref 22–32)
Calcium: 8.8 mg/dL — ABNORMAL LOW (ref 8.9–10.3)
Chloride: 102 mmol/L (ref 98–111)
Creatinine, Ser: 0.85 mg/dL (ref 0.44–1.00)
GFR calc Af Amer: >60 mL/min (ref >60)
GFR calc non Af Amer: >60 mL/min (ref >60)
Glucose, Bld: 98 mg/dL (ref 70–99)
Potassium: 3.8 mmol/L (ref 3.5–5.1)
Sodium: 137 mmol/L (ref 135–145)

## 2020-03-16 LAB — CBC WITH DIFFERENTIAL/PLATELET
Abs Immature Granulocytes: 0.02 10*3/uL (ref 0.00–0.07)
Basophils Absolute: 0 10*3/uL (ref 0.0–0.1)
Basophils Relative: 0 %
Eosinophils Absolute: 0.1 10*3/uL (ref 0.0–0.5)
Eosinophils Relative: 2 %
HCT: 31.1 % — ABNORMAL LOW (ref 36.0–46.0)
Hemoglobin: 10 g/dL — ABNORMAL LOW (ref 12.0–15.0)
Immature Granulocytes: 0 %
Lymphocytes Relative: 24 %
Lymphs Abs: 1.4 10*3/uL (ref 0.7–4.0)
MCH: 28 pg (ref 26.0–34.0)
MCHC: 32.2 g/dL (ref 30.0–36.0)
MCV: 87.1 fL (ref 80.0–100.0)
Monocytes Absolute: 0.6 10*3/uL (ref 0.1–1.0)
Monocytes Relative: 9 %
Neutro Abs: 3.8 10*3/uL (ref 1.7–7.7)
Neutrophils Relative %: 65 %
Platelets: 298 10*3/uL (ref 150–400)
RBC: 3.57 MIL/uL — ABNORMAL LOW (ref 3.87–5.11)
RDW: 12.7 % (ref 11.5–15.5)
WBC: 5.9 10*3/uL (ref 4.0–10.5)
nRBC: 0 % (ref 0.0–0.2)

## 2020-03-16 MED ORDER — OXYCODONE HCL 5 MG PO TABS
5.0000 mg | ORAL_TABLET | Freq: Four times a day (QID) | ORAL | Status: DC | PRN
  Administered 2020-03-16: 10 mg via ORAL
  Filled 2020-03-16: qty 2

## 2020-03-16 MED ORDER — ACETAMINOPHEN 325 MG PO TABS: 650.0000 mg | ORAL_TABLET | Freq: Four times a day (QID) | ORAL | Status: DC | PRN

## 2020-03-16 NOTE — Progress Notes (Addendum)
Gynecology Progress Note  Admission Date: 03/14/2020 Current Date: 03/16/2020 8:44 AM  Whitney Hickman is a 19 y.o. G1P1001 HD#3 admitted for PID, fevers, enteritis on CT and LO cyst   History complicated by: Patient Active Problem List   Diagnosis Date Noted  . Left ovarian cyst 03/16/2020  . Pelvic inflammatory disease 03/14/2020    ROS and patient/family/surgical history, located on admission H&P note dated 03/14/2020, have been reviewed, and there are no changes except as noted below Yesterday/Overnight Events:  Temp 38.1 at 1700 Results for chlamydia came back +  Subjective:  S/s stable to slightly improved (left sided pain). She did throw up once this morning but no nausea. No fevers, chills.   Objective:    Current Vital Signs 24h Vital Sign Ranges  T 98.5 F (36.9 C) Temp  Avg: 98.9 F (37.2 C)  Min: 97.5 F (36.4 C)  Max: 100.6 F (38.1 C)  BP (!) 101/54 BP  Min: 91/52  Max: 103/52  HR (!) 54 Pulse  Avg: 59  Min: 53  Max: 72  RR 17 Resp  Avg: 16  Min: 14  Max: 17  SaO2 100 % Room Air SpO2  Avg: 99.8 %  Min: 99 %  Max: 100 %       24 Hour I/O Current Shift I/O  Time Ins Outs 08/20 0701 - 08/21 0700 In: 4024 [P.O.:1440; I.V.:2112.4] Out: -  No intake/output data recorded.    Patient Vitals for the past 24 hrs:  BP Temp Temp src Pulse Resp SpO2  03/16/20 0820 (!) 101/54 98.5 F (36.9 C) Oral (!) 54 17 100 %  03/16/20 0421 (!) 91/52 (!) 97.5 F (36.4 C) Oral (!) 53 14 100 %  03/15/20 2113 (!) 101/59 98.1 F (36.7 C) Oral (!) 57 16 100 %  03/15/20 1733 (!) 99/50 100.1 F (37.8 C) Oral -- -- 100 %  03/15/20 1700 (!) 100/50 (!) 100.6 F (38.1 C) Oral -- 16 100 %  03/15/20 1402 (!) 103/52 98.4 F (36.9 C) Oral 72 17 99 %    Physical exam: General appearance: alert, cooperative and appears stated age Abdomen: +BS, mildly ttp diffusely, no peritoneal s/s, mild to moderately distended Lungs: clear to auscultation bilaterally Heart: S1, S2 normal, no murmur, rub  or gallop, regular rate and rhythm Extremities: no c/c/e Skin: warm and dry Psych: appropriate Neurologic: Grossly normal  Medications Current Facility-Administered Medications  Medication Dose Route Frequency Provider Last Rate Last Admin  . acetaminophen (TYLENOL) tablet 650 mg  650 mg Oral Q6H PRN [place] Bing, MD      . cefOXitin (MEFOXIN) 2 g in sodium chloride 0.9 % 100 mL IVPB  2 g Intravenous Q6H [place] Fillers, MD 200 mL/hr at 03/16/20 0532 2 g at 03/16/20 0532  . dextrose 5 % in lactated ringers infusion   Intravenous Continuous Warden Fillers, MD 100 mL/hr at 03/16/20 0135 New Bag at 03/16/20 0135  . docusate sodium (COLACE) capsule 100 mg  100 mg Oral BID Warden Fillers, MD   100 mg at 03/15/20 2212  . doxycycline (VIBRA-TABS) tablet 100 mg  100 mg Oral Q12H Warden Fillers, MD   100 mg at 03/16/20 0530  . ibuprofen (ADVIL) tablet 800 mg  800 mg Oral Q8H PRN Warden Fillers, MD      . ketorolac (TORADOL) injection 60 mg  60 mg Intramuscular Once Belaya, Maria A, PA-C      . ondansetron (ZOFRAN) tablet 4 mg  4 mg Oral Q6H PRN Warden Fillers, MD   4 mg at 03/15/20 0116   Or  . ondansetron [facility]) injection 4 mg  4 mg Intravenous Q6H PRN Warden Fillers, MD   4 mg at 03/16/20 6160  . oxyCODONE (Oxy IR/ROXICODONE) immediate release tablet 5-10 mg  5-10 mg Oral Q6H PRN [place] Bing, MD      . potassium chloride SA (KLOR-CON) CR tablet 20 mEq  20 mEq Oral BID Warden Fillers, MD   20 mEq at 03/15/20 2212  . zolpidem (AMBIEN) tablet 5 mg  5 mg Oral QHS PRN Warden Fillers, MD          Labs  Pending: bmp  Recent Labs  Lab 03/14/20 1328 03/15/20 0413 03/16/20 0652  WBC 11.3* 8.3 5.9  HGB 12.2 10.0* 10.0*  HCT 37.1 31.1* 31.1*  PLT 346 290 298    Recent Labs  Lab 03/12/20 0730 03/14/20 1328 03/15/20 0413  NA 139 135 134*  K 4.0 3.5 3.1*  CL 106 102 104  CO2 24 22 24   BUN 9 7 <5*  CREATININE 0.79 0.70 0.71  CALCIUM 9.2 9.2 8.3*  PROT 6.7 7.0  5.8*  BILITOT 0.3 0.5 0.4  ALKPHOS 74 77 65  ALT 39 23 19  AST 43* 16 22  GLUCOSE 101* 112* 142*    Radiology No new imaging  Assessment & Plan:  Pt stable *GYN: continue doxy D#3 and cefoxitin D#2. Pt continues to slow improve. Re-image as needed. Needs rpt u/s for LO cyst in 6-12 weeks.  -s/p rocephin 500 x 1 in ED *Pain: PO PRNs *FEN/GI: continue kdur. F/u bmp. Regular diet. sliv *PPx: scds, oob ad lib *Dispo: hopefully monday  Code Status: Full Code  Total time taking care of the patient was 20 minutes, with greater than 50% of the time spent in face to face interaction with the patient.  8-12 MD Attending Center for [facility]) GYN Consult Phone: [phone] (M-F, 0800-1700) & [phone] (Off hours, weekends, holidays)

## 2020-03-17 MED ORDER — OXYCODONE HCL 5 MG PO TABS: 5.0000 mg | ORAL_TABLET | Freq: Four times a day (QID) | ORAL | 0 refills | Status: DC | PRN

## 2020-03-17 MED ORDER — METRONIDAZOLE 500 MG PO TABS: 500.0000 mg | ORAL_TABLET | Freq: Two times a day (BID) | ORAL | 0 refills | Status: AC

## 2020-03-17 MED ORDER — DOXYCYCLINE HYCLATE 100 MG PO TABS
100.0000 mg | ORAL_TABLET | Freq: Two times a day (BID) | ORAL | 0 refills | Status: DC
Start: 2020-03-17 — End: 2021-06-14

## 2020-03-17 MED ORDER — IBUPROFEN 800 MG PO TABS
800.0000 mg | ORAL_TABLET | Freq: Three times a day (TID) | ORAL | 0 refills | Status: DC | PRN
Start: 2020-03-17 — End: 2021-06-14

## 2020-03-17 NOTE — Discharge Instructions (Signed)
Pelvic Inflammatory Disease  Pelvic inflammatory disease (PID) is caused by an infection in some or all of the female reproductive organs. The infection can be in the uterus, ovaries, fallopian tubes, or the surrounding tissues in the pelvis. PID can cause abdominal or pelvic pain that comes on suddenly (acute pelvic pain). PID is a serious infection because it can lead to lasting (chronic) pelvic pain or the inability to have children (infertility). What are the causes? This condition is most often caused by bacteria that is spread during sexual contact. It can also be caused by a bacterial infection of the vagina (bacterial vaginosis) that is not spread by sexual contact. This condition occurs when the infection is not treated and the bacteria travel upward from the vagina or cervix into the reproductive organs. Bacteria may also be introduced into the reproductive organs following:  The birth of a baby.  A miscarriage.  An abortion.  Major pelvic surgery.  The insertion of an intrauterine device (IUD).  A sexual assault. What increases the risk? You are more likely to develop this condition if you:  Are younger than 19 years of age.  Are sexually active at a young age.  Have a history of STI (sexually transmitted infection) or PID.  Do not regularly use barrier contraception methods, such as condoms.  Have multiple sexual partners.  Have sex with someone who has symptoms of an STI.  Use a vaginal douche.  Have recently had an IUD inserted. What are the signs or symptoms? Symptoms of this condition include:  Abdominal or pelvic pain.  Fever.  Chills.  Abnormal vaginal discharge.  Abnormal uterine bleeding.  Unusual pain shortly after the end of a menstrual period.  Painful urination.  Pain with sex.  Nausea and vomiting. How is this diagnosed? This condition is diagnosed based on a pelvic exam and medical history. A pelvic exam can reveal signs of  infection, inflammation, and discharge in the vagina and the surrounding tissues. It can also help to identify painful areas. You may also have tests, such as:  Lab tests, including a pregnancy test, blood tests, and a urine test.  Culture tests of the vagina and cervix to check for an STI.  Ultrasound.  A laparoscopic procedure to look inside the pelvis.  Examination of vaginal discharge under a microscope. How is this treated? This condition may be treated with:  Antibiotic medicines taken by mouth (orally). For more severe cases, antibiotics may be given through an IV at the hospital.  Surgery. This is rare. Surgery may be needed if other treatments do not help.  Efforts to stop the spread of the infection. Sexual partners may need to be treated if the infection is caused by an STI. It may take weeks until you are completely well. If you are diagnosed with PID, you should also be checked for HIV (human immunodeficiency virus). Your health care provider may test you for infection again 3 months after treatment. You should not have unprotected sex. Follow these instructions at home:  Take over-the-counter and prescription medicines only as told by your health care provider.  If you were prescribed an antibiotic medicine, take it as told by your health care provider. Do not stop using the antibiotic even if you start to feel better.  Do not have sex until treatment is completed or as told by your health care provider. If PID is confirmed, your recent sexual partners will need treatment, especially if you had unprotected sex.  Keep all   follow-up visits as told by your health care provider. This is important. Contact a health care provider if:  You have increased or abnormal vaginal discharge.  Your pain does not improve.  You vomit.  You have a fever.  You cannot tolerate your medicines.  Your partner has an STI.  You have pain when you urinate. Get help right away if:   You have increased abdominal or pelvic pain.  You have chills.  Your symptoms are not better in 72 hours with treatment. Summary  Pelvic inflammatory disease (PID) is caused by an infection in some or all of the female reproductive organs.  PID is a serious infection because it can lead to lasting (chronic) pelvic pain or the inability to have children (infertility).  This infection is usually treated with antibiotic medicines.  Do not have sex until treatment is completed or as told by your health care provider. This information is not intended to replace advice given to you by your health care provider. Make sure you discuss any questions you have with your health care provider. Document Revised: 03/31/2018 Document Reviewed: 04/05/2018 Elsevier Patient Education  2020 [employer]  

## 2020-03-17 NOTE — Progress Notes (Signed)
Discharge instructions addressed; Pt in stable condition.This Clinical research associate accompanied pt on the way out at the [employer]

## 2020-03-17 NOTE — Discharge Summary (Signed)
Physician Discharge Summary  Patient ID: Whitney Hickman MRN: [id number] DOB/AGE: 2000/10/01 19 y.o.  Admit date: 03/14/2020 Discharge date: 03/17/2020  Admission Diagnoses:PID   Discharge Diagnoses:  Active Problems:   Pelvic inflammatory disease   Left ovarian cyst   Discharged Condition: good  Hospital Course: Patient admitted with diagnosis of PID on 8/19. She was started on antibiotics (doxy and cefoxitin). Patient remained afebrile for 48 hours prior to discharge. She tolerated a regular diet. Her pain improved. Patient desired to be discharged today. Will arrange for outpatient follow up in [redacted] weeks along with repeat ultrasound in 6-12 weeks to reassess left complex adnexal mass. Discharge instructions were reviewed with the patient  Consults: None  Significant Diagnostic Studies: labs: WBC 11.3 on admission--> 5.9 on day of discharge  Treatments: antibiotics: doxy and cefoxitin  Discharge Exam: Blood pressure 122/68, pulse 65, temperature 98.4 F (36.9 C), temperature source Oral, resp. rate 16, height 5\' 3"  (1.6 m), weight 54.4 kg, last menstrual period 02/21/2020, SpO2 94 %, unknown if currently breastfeeding. General appearance: alert, cooperative and no distress Resp: clear to auscultation bilaterally Cardio: regular rate and rhythm GI: soft, non-tender; bowel sounds normal; no masses,  no organomegaly Extremities: extremities normal, atraumatic, no cyanosis or edema  Disposition: home   Allergies as of 03/17/2020   No Known Allergies     Medication List    TAKE these medications   acetaminophen 325 MG tablet Commonly known as: TYLENOL Take 650 mg by mouth every 6 (six) hours as needed for mild pain or fever.   doxycycline 100 MG capsule Commonly known as: VIBRAMYCIN Take 1 capsule (100 mg total) by mouth 2 (two) times daily for 14 days.   doxycycline 100 MG tablet Commonly known as: VIBRA-TABS Take 1 tablet (100 mg total) by mouth every 12 (twelve)  hours.   ibuprofen 800 MG tablet Commonly known as: ADVIL Take 1 tablet (800 mg total) by mouth every 8 (eight) hours as needed (mild pain). What changed:   medication strength  how much to take  when to take this  reasons to take this   metroNIDAZOLE 500 MG tablet Commonly known as: FLAGYL Take 1 tablet (500 mg total) by mouth 2 (two) times daily for 14 days.   oxyCODONE 5 MG immediate release tablet Commonly known as: Oxy IR/ROXICODONE Take 1-2 tablets (5-10 mg total) by mouth every 6 (six) hours as needed for severe pain.       Follow-up Information    [facility] Follow up in 2 week(s).   Specialty: Obstetrics and Gynecology Contact information: [address] [place] [phone]              Signed: [phone] 03/17/2020, 10:54 AM

## 2020-03-18 ENCOUNTER — Emergency Department (HOSPITAL_COMMUNITY)
Admission: EM | Admit: 2020-03-18 | Discharge: 2020-03-19 | Disposition: A | Discharge status: Home / Self Care | Payer: Medicaid Other | Attending: Emergency Medicine | Admitting: Emergency Medicine

## 2020-03-18 ENCOUNTER — Other Ambulatory Visit: Payer: Self-pay

## 2020-03-18 ENCOUNTER — Encounter (HOSPITAL_COMMUNITY): Payer: Self-pay | Admitting: Emergency Medicine

## 2020-03-18 DIAGNOSIS — Z5321 Procedure and treatment not carried out due to patient leaving prior to being seen by health care provider: Secondary | ICD-10-CM | POA: Insufficient documentation

## 2020-03-18 DIAGNOSIS — M79603 Pain in arm, unspecified: Secondary | ICD-10-CM | POA: Diagnosis not present

## 2020-03-18 NOTE — ED Triage Notes (Signed)
Pt. Stated, I was here on Aug. 19 had an IV and I still have a knot there

## 2020-03-19 NOTE — ED Notes (Signed)
Pt did not respond when called for vitals recheck and is not seen in or outside of lobby

## 2020-04-01 ENCOUNTER — Encounter (HOSPITAL_COMMUNITY): Payer: Self-pay | Admitting: *Deleted

## 2020-04-01 ENCOUNTER — Emergency Department (HOSPITAL_COMMUNITY): Payer: Medicaid Other

## 2020-04-01 ENCOUNTER — Emergency Department (HOSPITAL_COMMUNITY)
Admission: EM | Admit: 2020-04-01 | Discharge: 2020-04-01 | Disposition: A | Discharge status: Home / Self Care | Payer: Medicaid Other | Attending: Emergency Medicine | Admitting: Emergency Medicine

## 2020-04-01 ENCOUNTER — Other Ambulatory Visit: Payer: Self-pay

## 2020-04-01 DIAGNOSIS — F419 Anxiety disorder, unspecified: Secondary | ICD-10-CM

## 2020-04-01 DIAGNOSIS — R0602 Shortness of breath: Secondary | ICD-10-CM | POA: Diagnosis present

## 2020-04-01 DIAGNOSIS — Z20822 Contact with and (suspected) exposure to covid-19: Secondary | ICD-10-CM | POA: Insufficient documentation

## 2020-04-01 DIAGNOSIS — R061 Stridor: Secondary | ICD-10-CM | POA: Insufficient documentation

## 2020-04-01 DIAGNOSIS — R0682 Tachypnea, not elsewhere classified: Secondary | ICD-10-CM | POA: Insufficient documentation

## 2020-04-01 LAB — BASIC METABOLIC PANEL
Anion gap: 10 (ref 5–15)
BUN: 7 mg/dL (ref 6–20)
CO2: 21 mmol/L — ABNORMAL LOW (ref 22–32)
Calcium: 9.3 mg/dL (ref 8.9–10.3)
Chloride: 105 mmol/L (ref 98–111)
Creatinine, Ser: 0.81 mg/dL (ref 0.44–1.00)
GFR calc Af Amer: >60 mL/min (ref >60)
GFR calc non Af Amer: >60 mL/min (ref >60)
Glucose, Bld: 92 mg/dL (ref 70–99)
Potassium: 3.6 mmol/L (ref 3.5–5.1)
Sodium: 136 mmol/L (ref 135–145)

## 2020-04-01 LAB — CBC WITH DIFFERENTIAL/PLATELET
Abs Immature Granulocytes: 0.01 10*3/uL (ref 0.00–0.07)
Basophils Absolute: 0 10*3/uL (ref 0.0–0.1)
Basophils Relative: 0 %
Eosinophils Absolute: 0.1 10*3/uL (ref 0.0–0.5)
Eosinophils Relative: 2 %
HCT: 36.2 % (ref 36.0–46.0)
Hemoglobin: 11.5 g/dL — ABNORMAL LOW (ref 12.0–15.0)
Immature Granulocytes: 0 %
Lymphocytes Relative: 42 %
Lymphs Abs: 2 10*3/uL (ref 0.7–4.0)
MCH: 28 pg (ref 26.0–34.0)
MCHC: 31.8 g/dL (ref 30.0–36.0)
MCV: 88.1 fL (ref 80.0–100.0)
Monocytes Absolute: 0.3 10*3/uL (ref 0.1–1.0)
Monocytes Relative: 7 %
Neutro Abs: 2.3 10*3/uL (ref 1.7–7.7)
Neutrophils Relative %: 49 %
Platelets: 363 10*3/uL (ref 150–400)
RBC: 4.11 MIL/uL (ref 3.87–5.11)
RDW: 13.6 % (ref 11.5–15.5)
WBC: 4.8 10*3/uL (ref 4.0–10.5)
nRBC: 0 % (ref 0.0–0.2)

## 2020-04-01 LAB — I-STAT BETA HCG BLOOD, ED (MC, WL, AP ONLY): I-stat hCG, quantitative: <5 m[IU]/mL (ref <5)

## 2020-04-01 LAB — ETHANOL: Alcohol, Ethyl (B): <10 mg/dL (ref <10)

## 2020-04-01 LAB — SARS CORONAVIRUS 2 BY RT PCR (HOSPITAL ORDER, PERFORMED IN ~~LOC~~ HOSPITAL LAB): SARS Coronavirus 2: NEGATIVE

## 2020-04-01 MED ORDER — IPRATROPIUM-ALBUTEROL 0.5-2.5 (3) MG/3ML IN SOLN
3.0000 mL | Freq: Once | RESPIRATORY_TRACT | Status: AC
  Administered 2020-04-01: 3 mL via RESPIRATORY_TRACT

## 2020-04-01 MED ORDER — IPRATROPIUM-ALBUTEROL 0.5-2.5 (3) MG/3ML IN SOLN
RESPIRATORY_TRACT | Status: AC
  Filled 2020-04-01: qty 3

## 2020-04-01 MED ORDER — LORAZEPAM 2 MG/ML IJ SOLN
0.5000 mg | Freq: Once | INTRAMUSCULAR | Status: AC
  Administered 2020-04-01: 0.5 mg via INTRAVENOUS
  Filled 2020-04-01: qty 1

## 2020-04-01 MED ORDER — ONDANSETRON HCL 4 MG/2ML IJ SOLN
4.0000 mg | Freq: Once | INTRAMUSCULAR | Status: AC
  Administered 2020-04-01: 4 mg via INTRAVENOUS
  Filled 2020-04-01: qty 2

## 2020-04-01 MED ORDER — EPINEPHRINE 0.3 MG/0.3ML IJ SOAJ
0.3000 mg | Freq: Once | INTRAMUSCULAR | Status: AC
  Administered 2020-04-01: 0.3 mg via INTRAMUSCULAR

## 2020-04-01 MED ORDER — METHYLPREDNISOLONE SODIUM SUCC 125 MG IJ SOLR
125.0000 mg | Freq: Once | INTRAMUSCULAR | Status: AC
  Administered 2020-04-01: 125 mg via INTRAVENOUS

## 2020-04-01 NOTE — ED Provider Notes (Signed)
Pt feeling much better.  No stridor and tolerating po's.  She was d/ced home.    Gwyneth Sprout, MD 04/01/20 [phone]

## 2020-04-01 NOTE — ED Triage Notes (Signed)
Pt arrived with EMS from home c/o sob after smoking black and mild cigar. EMS reported sats low 90s with short gasps for air. sats improved with [place] oxygen. Pt not speaking only pointing to her throat.

## 2020-04-01 NOTE — ED Provider Notes (Signed)
[facility] EMERGENCY DEPARTMENT Provider Note   CSN: [id number] Arrival date & time: 04/01/20  2595     History Chief Complaint -shortness of breath  Level 5 caveat due to acuity of condition  Whitney Hickman is a 19 y.o. female.  The history is provided by the patient and the EMS personnel. The history is limited by the condition of the patient.  Shortness of Breath Severity:  Severe Onset quality:  Sudden Timing:  Constant Progression:  Worsening Relieved by:  None tried Worsened by:  Nothing Patient presents via EMS for shortness of breath.  It is reported the patient smoked a black a mild cigar and then became immediately short of breath.  Patient is also  having confusion per EMS, but was able to give them her birthdate.  No other details known on arrival     PMH-unknown Soc hx - unknown OB History   No obstetric history on file.     No family history on file.  Social History   Tobacco Use  . Smoking status: Not on file  Substance Use Topics  . Alcohol use: Not on file  . Drug use: Not on file    Home Medications Prior to Admission medications   Not on File    Allergies    Patient has no allergy information on record.  Review of Systems   Review of Systems  Unable to perform ROS: Acuity of condition  Respiratory: Positive for shortness of breath.     Physical Exam Updated Vital Signs BP 130/81   Pulse 96   Resp (!) 26   LMP 03/18/2020   SpO2 97%   Physical Exam CONSTITUTIONAL: Distress noted HEAD: Normocephalic/atraumatic EYES: EOMI/PERRL ENMT: Mucous membranes moist, no angioedema, uvula midline without edema or erythema, no tongue swelling noted.  Loud pitched stridor is noted No drooling NECK: supple no meningeal signs SPINE/BACK:entire spine nontender CV: S1/S2 noted, no murmurs/rubs/gallops noted LUNGS: Lungs are clear to auscultation bilaterally, tachypneic ABDOMEN: soft, nontender NEURO: Pt keeps her eyes closed  but appears in respiratory distress.  She moves all extremities x4.  She will follow commands EXTREMITIES: pulses normal/equal, full ROM SKIN: warm, color normal, no rash PSYCH: Unable to assess  ED Results / Procedures / Treatments   Labs (all labs ordered are listed, but only abnormal results are displayed) Labs Reviewed  BASIC METABOLIC PANEL - Abnormal; Notable for the following components:      Result Value   CO2 21 (*)    All other components within normal limits  CBC WITH DIFFERENTIAL/PLATELET - Abnormal; Notable for the following components:   Hemoglobin 11.5 (*)    All other components within normal limits  SARS CORONAVIRUS 2 BY RT PCR (HOSPITAL ORDER, PERFORMED IN [facility])  ETHANOL  RAPID URINE DRUG SCREEN, HOSP PERFORMED  I-STAT BETA HCG BLOOD, ED ([facility], [facility], [facility] ONLY)    EKG ED ECG REPORT   Date: 04/01/2020  Rate: 93  Rhythm: normal sinus rhythm  QRS Axis: normal  Intervals: normal  ST/T Wave abnormalities: nonspecific ST changes  Conduction Disutrbances:none  Narrative Interpretation: artifact noted  Old EKG Reviewed: none available  I have personally reviewed the EKG tracing and agree with the computerized printout as noted.  Radiology DG Neck Soft Tissue  Result Date: 04/01/2020 CLINICAL DATA:  Shortness of breath EXAM: NECK SOFT TISSUES - 1+ VIEW COMPARISON:  None. FINDINGS: There is no evidence of retropharyngeal soft tissue swelling or epiglottic enlargement. The cervical  airway is unremarkable and no radio-opaque foreign body identified. IMPRESSION: Negative. Electronically Signed   By: Deatra Robinson M.D.   On: 04/01/2020 06:26   DG Chest Port 1 View  Result Date: 04/01/2020 CLINICAL DATA:  Shortness of breath EXAM: PORTABLE CHEST 1 VIEW COMPARISON:  None. FINDINGS: The heart size and mediastinal contours are within normal limits. Both lungs are clear. The visualized skeletal structures are unremarkable. IMPRESSION: No active disease.  Electronically Signed   By: Deatra Robinson M.D.   On: 04/01/2020 06:26    Procedures .Critical Care Performed by: Zadie Rhine, MD Authorized by: Zadie Rhine, MD   Critical care provider statement:    Critical care time (minutes):  60   Critical care start time:  04/01/2020 5:45 AM   Critical care end time:  04/01/2020 6:45 AM   Critical care time was exclusive of:  Separately billable procedures and treating other patients   Critical care was necessary to treat or prevent imminent or life-threatening deterioration of the following conditions:  Respiratory failure   Critical care was time spent personally by me on the following activities:  Examination of patient, pulse oximetry, ordering and review of radiographic studies, ordering and performing treatments and interventions, ordering and review of laboratory studies, re-evaluation of patient's condition, evaluation of patient's response to treatment, development of treatment plan with patient or surrogate and obtaining history from patient or surrogate   I assumed direction of critical care for this patient from another provider in my specialty: no       Medications Ordered in ED Medications  ipratropium-albuterol (DUONEB) 0.5-2.5 (3) MG/3ML nebulizer solution (has no administration in time range)  ipratropium-albuterol (DUONEB) 0.5-2.5 (3) MG/3ML nebulizer solution 3 mL (3 mLs Nebulization Given 04/01/20 0545)  EPINEPHrine (EPI-PEN) injection 0.3 mg (0.3 mg Intramuscular Given 04/01/20 0551)  methylPREDNISolone sodium succinate (SOLU-MEDROL) 125 mg/2 mL injection 125 mg (125 mg Intravenous Given 04/01/20 0549)  ondansetron (ZOFRAN) injection 4 mg (4 mg Intravenous Given 04/01/20 0603)  LORazepam (ATIVAN) injection 0.5 mg (0.5 mg Intravenous Given 04/01/20 0604)    ED Course  I have reviewed the triage vital signs and the nursing notes.  Pertinent labs & imaging results that were available during my care of the patient were reviewed by me  and considered in my medical decision making (see chart for details).    MDM Rules/Calculators/A&P                          5:45 AM Patient presents via EMS in respiratory distress.  It is reported she smoked a black and mild and became distressed with stridor.  On my exam there is no angioedema.  No signs of systemic allergic reaction.  Unclear cause of her stridor.  She will be given albuterol, epinephrine, Solu-Medrol.  X-ray has been ordered. 7:09 AM I checked on patient multiple times. Mother was at bedside, she reports patient has never had this before Patient symptoms are beginning to improve, particular after be given Ativan.  I have personally reviewed the x-rays, no signs of any acute abnormalities on chest x-ray or soft tissue neck. Patient is now resting comfortably, pulse ox 99% on room air no tachycardia Signed out to Dr. Anitra Lauth at shift change to monitor If continues to improve and able to take PO she can be discharged If any recurrence of stridor, consult ENT for potential laryngoscopy Final Clinical Impression(s) / ED Diagnoses Final diagnoses:  Stridor    Rx /  DC Orders ED Discharge Orders    None       Zadie Rhine, MD 04/01/20 [phone]

## 2020-04-01 NOTE — ED Notes (Signed)
Pt provided with water, mother at bedside. Breathing seems to have improved with medications

## 2020-04-01 NOTE — Discharge Instructions (Signed)
The symptoms you had may have been from anxiety but also may have been related to something you came in contact with.  You have this again you may need allergy testing.

## 2020-04-02 ENCOUNTER — Telehealth (INDEPENDENT_AMBULATORY_CARE_PROVIDER_SITE_OTHER): Payer: Medicaid Other | Admitting: Obstetrics & Gynecology

## 2020-04-02 ENCOUNTER — Other Ambulatory Visit: Payer: Self-pay

## 2020-04-02 DIAGNOSIS — N739 Female pelvic inflammatory disease, unspecified: Secondary | ICD-10-CM

## 2020-04-02 NOTE — Patient Instructions (Signed)
Pelvic Inflammatory Disease  Pelvic inflammatory disease (PID) is an infection in some or all of the female reproductive organs. PID can be in the womb (uterus), ovaries, fallopian tubes, or nearby tissues that are inside the lower belly area (pelvis). PID can lead to problems if it is not treated. What are the causes?  Germs (bacteria) that are spread during sex. This is the most common cause.  Germs in the vagina that are not spread during sex.  Germs that travel up from the vagina or cervix to the reproductive organs after: ? The birth of a baby. ? A miscarriage. ? An abortion. ? Pelvic surgery. ? Insertion of an intrauterine device (IUD). ? A sexual assault. What increases the risk?  Being younger than 19 years old.  Having sex at a young age.  Having a history of STI (sexually transmitted infection) or PID.  Not using barrier birth control, such as condoms.  Having a lot of sex partners.  Having sex with someone who has symptoms of an STI.  Using a douche.  Having an IUD put in place. What are the signs or symptoms?  Pain in the belly area.  Fever.  Chills.  Discharge from the vagina that is not normal.  Bleeding from the womb that is not normal.  Pain soon after the end of a menstrual period.  Pain when you pee (urinate).  Pain with sex.  Feeling sick to your stomach (nauseous) or throwing up (vomiting). How is this treated?  Antibiotic medicines. In very bad cases, these may be given through an IV tube.  Surgery. This is rare.  Efforts to stop the spread of the infection. Sex partners may need to be treated. It may take weeks until you feel all better. Your doctor may test you for infection again after you finish treatment. You should also be checked for HIV (human immunodeficiency virus). Follow these instructions at home:  Take over-the-counter and prescription medicines only as told by your doctor.  If you were prescribed an antibiotic  medicine, take it as told by your doctor. Do not stop taking it even if you start to feel better.  Do not have sex until treatment is done or as told by your doctor.  Tell your sex partner if you have PID. Your partner may need to be treated.  Keep all follow-up visits as told by your doctor. This is important. Contact a doctor if:  You have more fluid or fluid that is not normal coming from your vagina.  Your pain does not improve.  You throw up.  You have a fever.  You cannot take your medicines.  Your partner has an STI.  You have pain when you pee. Get help right away if:  You have more pain in the belly area.  You have chills.  You are not better in 72 hours with treatment. Summary  Pelvic inflammatory disease (PID) is caused by an infection in some or all of the female reproductive organs.  PID is a serious infection.  This infection is most often treated with antibiotics.  Do not have sex until treatment is done or as told by your doctor. This information is not intended to replace advice given to you by your health care provider. Make sure you discuss any questions you have with your health care provider. Document Revised: 03/31/2018 Document Reviewed: 04/06/2018 Elsevier Patient Education  2020 Elsevier Inc[employer]  

## 2020-04-02 NOTE — Progress Notes (Signed)
TELEHEALTH GYNECOLOGY VISIT ENCOUNTER NOTE  I connected with Whitney Hickman on 04/02/20 at  8:00 AM EDT by telephone at home and verified that I am speaking with the correct person using two identifiers.   I discussed the limitations, risks, security and privacy concerns of performing an evaluation and management service by telephone and the availability of in person appointments. I also discussed with the patient that there may be a patient responsible charge related to this service. The patient expressed understanding and agreed to proceed.   History:  Whitney Hickman is a 19 y.o. G50P1001 female being evaluated today for f/u after hospitalization 2 weeks ago for PID, possible TOA . She denies any abnormal vaginal discharge, bleeding, pelvic pain or other concerns.  She did not finish her antibiotic due to nausea and emesis, and still has some GI sx, but she feels her pelvic symptoms are improved     Past Medical History:  Diagnosis Date  . Medical history non-contributory    Past Surgical History:  Procedure Laterality Date  . NO PAST SURGERIES     The following portions of the patient's history were reviewed and updated as appropriate: allergies, current medications, past family history, past medical history, past social history, past surgical history and problem list.   Health Maintenance:     Review of Systems:  Pertinent items noted in HPI and remainder of comprehensive ROS otherwise negative.  Physical Exam:   General:  Alert, oriented and cooperative.   Mental Status: Normal mood and affect perceived. Normal judgment and thought content.  Physical exam deferred due to nature of the encounter  Labs and Imaging No results found for this or any previous visit (from the past 336 hour(s)). CT ABDOMEN PELVIS W CONTRAST  Result Date: 03/14/2020 CLINICAL DATA:  19 year old female with abdominal pain. EXAM: CT ABDOMEN AND PELVIS WITH CONTRAST TECHNIQUE: Multidetector CT imaging of  the abdomen and pelvis was performed using the standard protocol following bolus administration of intravenous contrast. CONTRAST:  OMNIPAQUE IOHEXOL 300 MG/ML  SOLN COMPARISON:  Pelvic ultrasound dated 03/14/2020. FINDINGS: Lower chest: The visualized lung bases are clear. No intra-abdominal free air or free fluid. Hepatobiliary: No focal liver abnormality is seen. No gallstones, gallbladder wall thickening, or biliary dilatation. Pancreas: Unremarkable. No pancreatic ductal dilatation or surrounding inflammatory changes. Spleen: Normal in size without focal abnormality. Adrenals/Urinary Tract: The adrenal glands are unremarkable. The kidneys, and visualized ureters appear unremarkable. The urinary bladder is collapsed. Stomach/Bowel: There is diffuse mucosal thickening and enhancement of the small bowel consistent with enteritis. Clinical correlation is recommended. There is no bowel obstruction. The appendix is normal. Vascular/Lymphatic: The abdominal aorta and IVC are unremarkable. No portal venous gas. There is no adenopathy. Reproductive: The uterus is anteverted and grossly unremarkable. There is a 3 cm left ovarian corpus luteum. There is an enhancing tissue in the right hemipelvis anteriorly, likely vascular pedicle of the right ovary. Other: There is diffuse pelvic edema. Musculoskeletal: No acute or significant osseous findings. IMPRESSION: 1. Enteritis. Clinical correlation is recommended. No bowel obstruction. Normal appendix. 2. A 3 cm left ovarian corpus luteum. Electronically Signed   By: Elgie Collard M.D.   On: 03/14/2020 19:25   US PELVIC COMPLETE W TRANSVAGINAL AND TORSION R/O  Result Date: 03/14/2020 CLINICAL DATA:  Adnexal pain. EXAM: TRANSABDOMINAL AND TRANSVAGINAL ULTRASOUND OF PELVIS DOPPLER ULTRASOUND OF OVARIES TECHNIQUE: Both transabdominal and transvaginal ultrasound examinations of the pelvis were performed. Transabdominal technique was performed for global imaging of the  pelvis including uterus, ovaries, adnexal regions, and pelvic cul-de-sac. It was necessary to proceed with endovaginal exam following the transabdominal exam to visualize the ovaries and adnexa. Color and duplex Doppler ultrasound was utilized to evaluate blood flow to the ovaries. COMPARISON:  Concurrent abdominal CT, available at time of ultrasound assessment FINDINGS: Uterus Measurements: 8.8 x 4.3 x 5.1 cm = volume: 101 mL. No fibroids or other mass visualized. Endometrium Thickness: 10 mm, normal.  No focal abnormality visualized. Right ovary Measurements: 4.8 x 2.5 x 2.9 cm = volume: 18.1 mL. Multiple follicles. Arterial and venous flow is seen with question of ovarian hypervascularity. No adnexal mass. Left ovary Measurements: 5.5 x 3.6 x 4.7 cm = volume: 48 mL. There is increased ovarian blood flow. Heterogeneous complex area in the left ovary measuring approximately 3.6 x 3.3 x 3.2 cm has peripheral but no internal flow. Patient was exquisitely tender in the left lower quadrant which limited assessment. Pulsed Doppler evaluation of both ovaries demonstrates normal low-resistance arterial and venous waveforms. Other findings Small to moderate volume of complex heterogeneous free fluid in the pelvis. IMPRESSION: 1. Slight increase bilateral adnexal/ovarian blood flow with complex free fluid in the pelvis. Differential considerations include pelvic inflammatory disease or pelvic congestion syndrome with a concurrent hemorrhagic cyst on the left, described below. 2. Complex heterogeneous 3.6 x 3.3 x 3.2 cm structure in the left ovary with peripheral but no internal vascularity. This may represent a hemorrhagic cyst. The possibility of solid lesion or less likely tubo-ovarian abscess are considered. Short-interval follow up ultrasound in 6-12 weeks is recommended, preferably during the week following the patient's normal menses. 3. Normal sonographic appearance of the uterus. 4. No ovarian torsion.  Electronically Signed   By: Narda Rutherford M.D.   On: 03/14/2020 19:27      Assessment and Plan:     1. Pelvic inflammatory disease S/p inpatient treatment, did not complete her outpatient antibiotic. F/U for left adnexal mass - US PELVIC COMPLETE WITH TRANSVAGINAL; Future       I discussed the assessment and treatment plan with the patient. The patient was provided an opportunity to ask questions and all were answered. The patient agreed with the plan and demonstrated an understanding of the instructions.   The patient was advised to call back or seek an in-person evaluation/go to the ED if the symptoms worsen or if the condition fails to improve as anticipated.  I provided 15 minutes of non-face-to-face time during this encounter.   Scheryl Darter, MD [facility], [facility]

## 2020-04-02 NOTE — Progress Notes (Signed)
I connected with  Wynelle Link on 04/02/20 at  8:00 AM EDT by telephone and verified that I am speaking with the correct person using two identifiers.   I discussed the limitations, risks, security and privacy concerns of performing an evaluation and management service by telephone and the availability of in person appointments. I also discussed with the patient that there may be a patient responsible charge related to this service. The patient expressed understanding and agreed to proceed.  Janene Madeira Amauria Younts, CMA 04/02/2020  8:19 AM

## 2020-04-10 ENCOUNTER — Ambulatory Visit: Admission: RE | Admit: 2020-04-10 | Payer: Medicaid Other | Source: Ambulatory Visit

## 2020-04-17 ENCOUNTER — Ambulatory Visit: Admission: RE | Admit: 2020-04-17 | Payer: Medicaid Other | Source: Ambulatory Visit

## 2020-04-24 ENCOUNTER — Other Ambulatory Visit: Payer: Medicaid Other

## 2020-05-06 ENCOUNTER — Inpatient Hospital Stay: Admission: RE | Admit: 2020-05-06 | Payer: Medicaid Other | Source: Ambulatory Visit

## 2020-05-27 ENCOUNTER — Encounter (HOSPITAL_COMMUNITY): Payer: Self-pay | Admitting: Emergency Medicine

## 2021-06-12 ENCOUNTER — Inpatient Hospital Stay (HOSPITAL_COMMUNITY): Payer: Medicaid Other | Admitting: Certified Registered"

## 2021-06-12 ENCOUNTER — Encounter (HOSPITAL_COMMUNITY): Payer: Self-pay

## 2021-06-12 ENCOUNTER — Inpatient Hospital Stay (HOSPITAL_COMMUNITY)
Admission: AD | Admit: 2021-06-12 | Discharge: 2021-06-14 | DRG: 818 | Disposition: A | Discharge status: Home / Self Care | Payer: Medicaid Other | Attending: Obstetrics and Gynecology | Admitting: Obstetrics and Gynecology

## 2021-06-12 ENCOUNTER — Inpatient Hospital Stay (HOSPITAL_COMMUNITY): Payer: Medicaid Other

## 2021-06-12 ENCOUNTER — Other Ambulatory Visit: Payer: Self-pay

## 2021-06-12 ENCOUNTER — Encounter (HOSPITAL_COMMUNITY)
Admission: AD | Disposition: A | Discharge status: Home / Self Care | Payer: Self-pay | Source: Home / Self Care | Attending: Obstetrics and Gynecology

## 2021-06-12 DIAGNOSIS — Z0289 Encounter for other administrative examinations: Secondary | ICD-10-CM

## 2021-06-12 DIAGNOSIS — I959 Hypotension, unspecified: Secondary | ICD-10-CM | POA: Diagnosis not present

## 2021-06-12 DIAGNOSIS — F17290 Nicotine dependence, other tobacco product, uncomplicated: Secondary | ICD-10-CM | POA: Diagnosis present

## 2021-06-12 DIAGNOSIS — Z9889 Other specified postprocedural states: Secondary | ICD-10-CM

## 2021-06-12 DIAGNOSIS — O00109 Unspecified tubal pregnancy without intrauterine pregnancy: Secondary | ICD-10-CM | POA: Diagnosis present

## 2021-06-12 DIAGNOSIS — O3680X0 Pregnancy with inconclusive fetal viability, not applicable or unspecified: Secondary | ICD-10-CM

## 2021-06-12 DIAGNOSIS — Z419 Encounter for procedure for purposes other than remedying health state, unspecified: Secondary | ICD-10-CM

## 2021-06-12 DIAGNOSIS — O00101 Right tubal pregnancy without intrauterine pregnancy: Principal | ICD-10-CM | POA: Diagnosis present

## 2021-06-12 DIAGNOSIS — R000 Tachycardia, unspecified: Secondary | ICD-10-CM | POA: Diagnosis not present

## 2021-06-12 DIAGNOSIS — D62 Acute posthemorrhagic anemia: Secondary | ICD-10-CM | POA: Diagnosis not present

## 2021-06-12 DIAGNOSIS — R109 Unspecified abdominal pain: Secondary | ICD-10-CM

## 2021-06-12 DIAGNOSIS — O26891 Other specified pregnancy related conditions, first trimester: Secondary | ICD-10-CM

## 2021-06-12 HISTORY — PX: DIAGNOSTIC LAPAROSCOPY WITH REMOVAL OF ECTOPIC PREGNANCY: SHX6449

## 2021-06-12 HISTORY — DX: Acute parametritis and pelvic cellulitis: N73.0

## 2021-06-12 LAB — URINALYSIS, ROUTINE W REFLEX MICROSCOPIC
Bilirubin Urine: NEGATIVE
Glucose, UA: NEGATIVE mg/dL
Hgb urine dipstick: NEGATIVE
Ketones, ur: NEGATIVE mg/dL
Leukocytes,Ua: NEGATIVE
Nitrite: NEGATIVE
Protein, ur: NEGATIVE mg/dL
Specific Gravity, Urine: 1.008 (ref 1.005–1.030)
pH: 6 (ref 5.0–8.0)

## 2021-06-12 LAB — HCG, QUANTITATIVE, PREGNANCY: hCG, Beta Chain, Quant, S: 75488 m[IU]/mL — ABNORMAL HIGH (ref <5)

## 2021-06-12 LAB — WET PREP, GENITAL
Clue Cells Wet Prep HPF POC: NONE SEEN
Sperm: NONE SEEN
Trich, Wet Prep: NONE SEEN
WBC, Wet Prep HPF POC: <10 (ref <10)
Yeast Wet Prep HPF POC: NONE SEEN

## 2021-06-12 LAB — COMPREHENSIVE METABOLIC PANEL
ALT: 13 U/L (ref 0–44)
AST: 22 U/L (ref 15–41)
Albumin: 3.9 g/dL (ref 3.5–5.0)
Alkaline Phosphatase: 43 U/L (ref 38–126)
Anion gap: 11 (ref 5–15)
BUN: 7 mg/dL (ref 6–20)
CO2: 23 mmol/L (ref 22–32)
Calcium: 9.4 mg/dL (ref 8.9–10.3)
Chloride: 98 mmol/L (ref 98–111)
Creatinine, Ser: 0.61 mg/dL (ref 0.44–1.00)
GFR, Estimated: >60 mL/min (ref >60)
Glucose, Bld: 95 mg/dL (ref 70–99)
Potassium: 4.3 mmol/L (ref 3.5–5.1)
Sodium: 132 mmol/L — ABNORMAL LOW (ref 135–145)
Total Bilirubin: 0.9 mg/dL (ref 0.3–1.2)
Total Protein: 7.1 g/dL (ref 6.5–8.1)

## 2021-06-12 LAB — CBC WITH DIFFERENTIAL/PLATELET
Abs Immature Granulocytes: 0.06 10*3/uL (ref 0.00–0.07)
Basophils Absolute: 0 10*3/uL (ref 0.0–0.1)
Basophils Relative: 0 %
Eosinophils Absolute: 0 10*3/uL (ref 0.0–0.5)
Eosinophils Relative: 0 %
HCT: 37 % (ref 36.0–46.0)
Hemoglobin: 12 g/dL (ref 12.0–15.0)
Immature Granulocytes: 1 %
Lymphocytes Relative: 9 %
Lymphs Abs: 1.2 10*3/uL (ref 0.7–4.0)
MCH: 29.3 pg (ref 26.0–34.0)
MCHC: 32.4 g/dL (ref 30.0–36.0)
MCV: 90.2 fL (ref 80.0–100.0)
Monocytes Absolute: 0.5 10*3/uL (ref 0.1–1.0)
Monocytes Relative: 4 %
Neutro Abs: 10.7 10*3/uL — ABNORMAL HIGH (ref 1.7–7.7)
Neutrophils Relative %: 86 %
Platelets: 261 10*3/uL (ref 150–400)
RBC: 4.1 MIL/uL (ref 3.87–5.11)
RDW: 13 % (ref 11.5–15.5)
WBC: 12.4 10*3/uL — ABNORMAL HIGH (ref 4.0–10.5)
nRBC: 0 % (ref 0.0–0.2)

## 2021-06-12 LAB — RAPID URINE DRUG SCREEN, HOSP PERFORMED
Amphetamines: NOT DETECTED
Barbiturates: NOT DETECTED
Benzodiazepines: NOT DETECTED
Cocaine: NOT DETECTED
Opiates: NOT DETECTED
Tetrahydrocannabinol: NOT DETECTED

## 2021-06-12 LAB — PREPARE RBC (CROSSMATCH)

## 2021-06-12 LAB — POCT PREGNANCY, URINE: Preg Test, Ur: POSITIVE — AB

## 2021-06-12 SURGERY — LAPAROSCOPY, WITH ECTOPIC PREGNANCY SURGICAL TREATMENT
Anesthesia: General | Laterality: Right

## 2021-06-12 MED ORDER — ONDANSETRON HCL 4 MG PO TABS: 4.0000 mg | ORAL_TABLET | Freq: Four times a day (QID) | ORAL | Status: DC | PRN

## 2021-06-12 MED ORDER — SODIUM CHLORIDE 0.9% IV SOLUTION: Freq: Once | INTRAVENOUS | Status: DC

## 2021-06-12 MED ORDER — OXYCODONE HCL 5 MG PO TABS
5.0000 mg | ORAL_TABLET | ORAL | Status: DC | PRN
  Administered 2021-06-13: 5 mg via ORAL
  Administered 2021-06-13 (×4): 10 mg via ORAL
  Administered 2021-06-14 (×3): 5 mg via ORAL
  Filled 2021-06-12 (×3): qty 2
  Filled 2021-06-12 (×2): qty 1
  Filled 2021-06-12: qty 2
  Filled 2021-06-12: qty 1
  Filled 2021-06-12: qty 2
  Filled 2021-06-12: qty 1

## 2021-06-12 MED ORDER — DEXAMETHASONE SODIUM PHOSPHATE 10 MG/ML IJ SOLN
INTRAMUSCULAR | Status: DC | PRN
  Administered 2021-06-12: 5 mg via INTRAVENOUS

## 2021-06-12 MED ORDER — LACTATED RINGERS IV SOLN: INTRAVENOUS | Status: DC

## 2021-06-12 MED ORDER — 0.9 % SODIUM CHLORIDE (POUR BTL) OPTIME
TOPICAL | Status: DC | PRN
  Administered 2021-06-12 (×2): 1000 mL

## 2021-06-12 MED ORDER — ROCURONIUM BROMIDE 10 MG/ML (PF) SYRINGE
PREFILLED_SYRINGE | INTRAVENOUS | Status: DC | PRN
  Administered 2021-06-12: 40 mg via INTRAVENOUS

## 2021-06-12 MED ORDER — PHENYLEPHRINE HCL-NACL 20-0.9 MG/250ML-% IV SOLN
INTRAVENOUS | Status: DC | PRN
  Administered 2021-06-12: 25 ug/min via INTRAVENOUS

## 2021-06-12 MED ORDER — LACTATED RINGERS IV SOLN: Freq: Once | INTRAVENOUS | Status: AC

## 2021-06-12 MED ORDER — POLYETHYLENE GLYCOL 3350 17 G PO PACK
17.0000 g | PACK | Freq: Every day | ORAL | Status: DC
  Administered 2021-06-13 – 2021-06-14 (×2): 17 g via ORAL
  Filled 2021-06-12 (×2): qty 1

## 2021-06-12 MED ORDER — PROPOFOL 10 MG/ML IV BOLUS
INTRAVENOUS | Status: AC
  Filled 2021-06-12: qty 20

## 2021-06-12 MED ORDER — HYDROMORPHONE HCL 1 MG/ML IJ SOLN
0.5000 mg | Freq: Once | INTRAMUSCULAR | Status: AC
  Administered 2021-06-12: 15:00:00 0.5 mg via INTRAVENOUS
  Filled 2021-06-12: qty 1

## 2021-06-12 MED ORDER — ACETAMINOPHEN 325 MG PO TABS
650.0000 mg | ORAL_TABLET | ORAL | Status: DC | PRN
  Administered 2021-06-13 – 2021-06-14 (×5): 650 mg via ORAL
  Filled 2021-06-12 (×6): qty 2

## 2021-06-12 MED ORDER — DEXAMETHASONE SODIUM PHOSPHATE 10 MG/ML IJ SOLN
INTRAMUSCULAR | Status: AC
  Filled 2021-06-12: qty 1

## 2021-06-12 MED ORDER — ONDANSETRON HCL 4 MG/2ML IJ SOLN
4.0000 mg | Freq: Four times a day (QID) | INTRAMUSCULAR | Status: DC | PRN
  Administered 2021-06-14: 4 mg via INTRAVENOUS
  Filled 2021-06-12: qty 2

## 2021-06-12 MED ORDER — PROPOFOL 10 MG/ML IV BOLUS
INTRAVENOUS | Status: DC | PRN
  Administered 2021-06-12: 100 mg via INTRAVENOUS

## 2021-06-12 MED ORDER — FENTANYL CITRATE (PF) 100 MCG/2ML IJ SOLN
INTRAMUSCULAR | Status: AC
  Filled 2021-06-12: qty 2

## 2021-06-12 MED ORDER — ONDANSETRON HCL 4 MG/2ML IJ SOLN
INTRAMUSCULAR | Status: AC
  Filled 2021-06-12: qty 2

## 2021-06-12 MED ORDER — SUCCINYLCHOLINE CHLORIDE 200 MG/10ML IV SOSY
PREFILLED_SYRINGE | INTRAVENOUS | Status: DC | PRN
  Administered 2021-06-12: 80 mg via INTRAVENOUS

## 2021-06-12 MED ORDER — HYDROMORPHONE HCL 1 MG/ML IJ SOLN
1.0000 mg | Freq: Once | INTRAMUSCULAR | Status: DC
  Filled 2021-06-12: qty 1

## 2021-06-12 MED ORDER — PHENYLEPHRINE 40 MCG/ML (10ML) SYRINGE FOR IV PUSH (FOR BLOOD PRESSURE SUPPORT)
PREFILLED_SYRINGE | INTRAVENOUS | Status: AC
  Filled 2021-06-12: qty 10

## 2021-06-12 MED ORDER — LIDOCAINE 2% (20 MG/ML) 5 ML SYRINGE
INTRAMUSCULAR | Status: DC | PRN
  Administered 2021-06-12: 60 mg via INTRAVENOUS

## 2021-06-12 MED ORDER — ONDANSETRON HCL 4 MG/2ML IJ SOLN
INTRAMUSCULAR | Status: DC | PRN
  Administered 2021-06-12: 4 mg via INTRAVENOUS

## 2021-06-12 MED ORDER — HYDROMORPHONE HCL 1 MG/ML IJ SOLN
1.0000 mg | Freq: Once | INTRAMUSCULAR | Status: AC
  Administered 2021-06-12: 22:00:00 1 mg via INTRAVENOUS
  Filled 2021-06-12: qty 1

## 2021-06-12 MED ORDER — SUGAMMADEX SODIUM 200 MG/2ML IV SOLN
INTRAVENOUS | Status: DC | PRN
  Administered 2021-06-12: 100 mg via INTRAVENOUS

## 2021-06-12 MED ORDER — FENTANYL CITRATE (PF) 250 MCG/5ML IJ SOLN
INTRAMUSCULAR | Status: AC
  Filled 2021-06-12: qty 5

## 2021-06-12 MED ORDER — SUCCINYLCHOLINE CHLORIDE 200 MG/10ML IV SOSY
PREFILLED_SYRINGE | INTRAVENOUS | Status: AC
  Filled 2021-06-12: qty 10

## 2021-06-12 MED ORDER — FENTANYL CITRATE (PF) 100 MCG/2ML IJ SOLN
25.0000 ug | INTRAMUSCULAR | Status: DC | PRN
  Administered 2021-06-12 (×4): 25 ug via INTRAVENOUS

## 2021-06-12 MED ORDER — ACETAMINOPHEN 10 MG/ML IV SOLN
1000.0000 mg | Freq: Once | INTRAVENOUS | Status: DC | PRN
  Administered 2021-06-12: 20:00:00 1000 mg via INTRAVENOUS

## 2021-06-12 MED ORDER — ACETAMINOPHEN 10 MG/ML IV SOLN
INTRAVENOUS | Status: AC
  Filled 2021-06-12: qty 100

## 2021-06-12 MED ORDER — PANTOPRAZOLE SODIUM 40 MG IV SOLR
40.0000 mg | Freq: Every day | INTRAVENOUS | Status: DC
  Administered 2021-06-12 – 2021-06-13 (×2): 40 mg via INTRAVENOUS
  Filled 2021-06-12 (×2): qty 40

## 2021-06-12 MED ORDER — ROCURONIUM BROMIDE 10 MG/ML (PF) SYRINGE
PREFILLED_SYRINGE | INTRAVENOUS | Status: AC
  Filled 2021-06-12: qty 10

## 2021-06-12 MED ORDER — HEMOSTATIC AGENTS (NO CHARGE) OPTIME
TOPICAL | Status: DC | PRN
  Administered 2021-06-12: 1 via TOPICAL

## 2021-06-12 MED ORDER — FENTANYL CITRATE (PF) 100 MCG/2ML IJ SOLN
INTRAMUSCULAR | Status: DC | PRN
  Administered 2021-06-12 (×2): 100 ug via INTRAVENOUS
  Administered 2021-06-12: 50 ug via INTRAVENOUS

## 2021-06-12 MED ORDER — SIMETHICONE 80 MG PO CHEW
80.0000 mg | CHEWABLE_TABLET | Freq: Three times a day (TID) | ORAL | Status: DC
  Administered 2021-06-13 – 2021-06-14 (×4): 80 mg via ORAL
  Filled 2021-06-12 (×4): qty 1

## 2021-06-12 MED ORDER — MIDAZOLAM HCL 2 MG/2ML IJ SOLN
INTRAMUSCULAR | Status: AC
  Filled 2021-06-12: qty 2

## 2021-06-12 MED ORDER — PROMETHAZINE HCL 25 MG/ML IJ SOLN: 6.2500 mg | INTRAMUSCULAR | Status: DC | PRN

## 2021-06-12 MED ORDER — LIDOCAINE 2% (20 MG/ML) 5 ML SYRINGE
INTRAMUSCULAR | Status: AC
  Filled 2021-06-12: qty 5

## 2021-06-12 SURGICAL SUPPLY — 48 items
APPLICATOR COTTON TIP 6 STRL (MISCELLANEOUS) ×1 IMPLANT
APPLICATOR COTTON TIP 6IN STRL (MISCELLANEOUS) ×2 IMPLANT
BENZOIN TINCTURE PRP APPL 2/3 (GAUZE/BANDAGES/DRESSINGS) ×2 IMPLANT
BLADE SURG 15 STRL LF DISP TIS (BLADE) ×1 IMPLANT
BLADE SURG 15 STRL SS (BLADE) ×1
CABLE HIGH FREQUENCY MONO STRZ (ELECTRODE) IMPLANT
CELLS DAT CNTRL 66122 CELL SVR (MISCELLANEOUS) ×1 IMPLANT
DEFOGGER SCOPE WARMER CLEARIFY (MISCELLANEOUS) ×2 IMPLANT
DERMABOND ADVANCED (GAUZE/BANDAGES/DRESSINGS) ×1
DERMABOND ADVANCED .7 DNX12 (GAUZE/BANDAGES/DRESSINGS) ×1 IMPLANT
DRSG OPSITE POSTOP 3X4 (GAUZE/BANDAGES/DRESSINGS) IMPLANT
DRSG OPSITE POSTOP 4X10 (GAUZE/BANDAGES/DRESSINGS) ×2 IMPLANT
DURAPREP 26ML APPLICATOR (WOUND CARE) ×2 IMPLANT
ELECT REM PT RETURN 9FT ADLT (ELECTROSURGICAL) ×2
ELECTRODE REM PT RTRN 9FT ADLT (ELECTROSURGICAL) ×1 IMPLANT
GLOVE SURG POLYISO LF SZ7 (GLOVE) ×2 IMPLANT
GLOVE SURG UNDER POLY LF SZ7 (GLOVE) ×4 IMPLANT
GLOVE SURG UNDER POLY LF SZ7.5 (GLOVE) ×4 IMPLANT
GOWN STRL REUS W/ TWL LRG LVL3 (GOWN DISPOSABLE) ×3 IMPLANT
GOWN STRL REUS W/TWL LRG LVL3 (GOWN DISPOSABLE) ×3
HEMOSTAT SURGICEL 2X14 (HEMOSTASIS) ×2 IMPLANT
KIT TURNOVER KIT B (KITS) ×2 IMPLANT
LIGASURE VESSEL 5MM BLUNT TIP (ELECTROSURGICAL) IMPLANT
NS IRRIG 1000ML POUR BTL (IV SOLUTION) ×2 IMPLANT
PACK LAPAROSCOPY BASIN (CUSTOM PROCEDURE TRAY) ×2 IMPLANT
PACK TRENDGUARD 450 HYBRID PRO (MISCELLANEOUS) IMPLANT
PAD OB MATERNITY 4.3X12.25 (PERSONAL CARE ITEMS) ×2 IMPLANT
POUCH LAPAROSCOPIC INSTRUMENT (MISCELLANEOUS) ×2 IMPLANT
POUCH SPECIMEN RETRIEVAL 10MM (ENDOMECHANICALS) IMPLANT
PROTECTOR NERVE ULNAR (MISCELLANEOUS) ×4 IMPLANT
RTRCTR WOUND ALEXIS 18CM MED (MISCELLANEOUS) ×2
SCISSORS LAP 5X35 DISP (ENDOMECHANICALS) IMPLANT
SET TUBE SMOKE EVAC HIGH FLOW (TUBING) ×2 IMPLANT
SLEEVE ADV FIXATION 5X100MM (TROCAR) IMPLANT
STRIP CLOSURE SKIN 1/2X4 (GAUZE/BANDAGES/DRESSINGS) ×2 IMPLANT
SUT CHROMIC GUT 2 0 PS 2 27 (SUTURE) ×2 IMPLANT
SUT MNCRL AB 4-0 PS2 18 (SUTURE) ×2 IMPLANT
SUT MON AB 4-0 PS1 27 (SUTURE) ×2 IMPLANT
SUT VIC AB 3-0 CT1 27 (SUTURE) ×1
SUT VIC AB 3-0 CT1 TAPERPNT 27 (SUTURE) ×1 IMPLANT
SUT VICRYL 0 UR6 27IN ABS (SUTURE) ×2 IMPLANT
SYR 10ML LL (SYRINGE) ×2 IMPLANT
TOWEL GREEN STERILE FF (TOWEL DISPOSABLE) ×4 IMPLANT
TRAY FOLEY W/BAG SLVR 14FR (SET/KITS/TRAYS/PACK) ×2 IMPLANT
TRENDGUARD 450 HYBRID PRO PACK (MISCELLANEOUS)
TROCAR ADV FIXATION 5X100MM (TROCAR) IMPLANT
TROCAR BALLN 12MMX100 BLUNT (TROCAR) IMPLANT
WARMER LAPAROSCOPE (MISCELLANEOUS) ×2 IMPLANT

## 2021-06-12 NOTE — Progress Notes (Signed)
GYN Note  AF VS normal and stable and great UOP; pt eating clears w/o issue and pain helped with dose of IV dilaudid. [place] c/d/I. Operative findings d/w her. She would like STI bloodwork check.  Cornelia Copa MD Attending [facility] [employer] (Faculty Practice) 06/12/2021 Time: 2336

## 2021-06-12 NOTE — Transfer of Care (Signed)
Immediate Anesthesia Transfer of Care Note  Patient: Whitney Hickman  Procedure(s) Performed: EXPLORATORY  LAPAROSCOPY WITH REMOVAL OF ECTOPIC PREGNANCY (Right)  Patient Location: PACU  Anesthesia Type:General  Level of Consciousness: drowsy and patient cooperative  Airway & Oxygen Therapy: Patient Spontanous Breathing and Patient connected to nasal cannula oxygen  Post-op Assessment: Report given to RN, Post -op Vital signs reviewed and stable and Patient moving all extremities  Post vital signs: Reviewed and stable  Last Vitals:  Vitals Value Taken Time  BP 127/74 06/12/21 1935  Temp    Pulse 75 06/12/21 1936  Resp 12 06/12/21 1936  SpO2 100 % 06/12/21 1936  Vitals shown include unvalidated device data.  Last Pain:  Vitals:   06/12/21 1427  TempSrc: Oral  PainSc:          Complications: No notable events documented.

## 2021-06-12 NOTE — Anesthesia Procedure Notes (Signed)
Procedure Name: Intubation Date/Time: 06/12/2021 6:05 PM Performed by: Moshe Salisbury, CRNA Pre-anesthesia Checklist: Patient identified, Emergency Drugs available, Suction available and Patient being monitored Patient Re-evaluated:Patient Re-evaluated prior to induction Oxygen Delivery Method: Circle System Utilized Preoxygenation: Pre-oxygenation with 100% oxygen Induction Type: IV induction Ventilation: Mask ventilation without difficulty Laryngoscope Size: Mac and 3 Grade View: Grade I Tube type: Oral Tube size: 7.0 mm Number of attempts: 1 Airway Equipment and Method: Stylet Placement Confirmation: ETT inserted through vocal cords under direct vision, positive ETCO2 and breath sounds checked- equal and bilateral Secured at: 20 cm Tube secured with: Tape Dental Injury: Teeth and Oropharynx as per pre-operative assessment

## 2021-06-12 NOTE — OR Nursing (Signed)
Patient came to the OR with her cell phone and her pink blanket. Both were placed into bags and labeled with the patient label. They were placed at the circulator's desk during the surgery and will be taken to PACU when the case is done.

## 2021-06-12 NOTE — Anesthesia Preprocedure Evaluation (Signed)
Anesthesia Evaluation  Patient identified by MRN, date of birth, ID band  Reviewed: NPO status , Patient's Chart, lab work & pertinent test resultsPreop documentation limited or incomplete due to emergent nature of procedure.  Airway Mallampati: II  TM Distance: >3 FB Neck ROM: Full    Dental no notable dental hx.    Pulmonary neg pulmonary ROS, Current Smoker,    Pulmonary exam normal        Cardiovascular negative cardio ROS   Rhythm:Regular Rate:Tachycardia     Neuro/Psych negative neurological ROS  negative psych ROS   GI/Hepatic negative GI ROS, Neg liver ROS,   Endo/Other  negative endocrine ROS  Renal/GU negative Renal ROS  Female GU complaint Ruptured ectopic     Musculoskeletal negative musculoskeletal ROS (+)   Abdominal Normal abdominal exam  (+)   Peds  Hematology negative hematology ROS (+)   Anesthesia Other Findings   Reproductive/Obstetrics PID                             Anesthesia Physical Anesthesia Plan  ASA: 2 and emergent  Anesthesia Plan: General   Post-op Pain Management:    Induction: Intravenous and Rapid sequence  PONV Risk Score and Plan: 2 and Ondansetron and Dexamethasone  Airway Management Planned: Mask and Oral ETT  Additional Equipment: None  Intra-op Plan:   Post-operative Plan: Extubation in OR  Informed Consent: I have reviewed the patients History and Physical, chart, labs and discussed the procedure including the risks, benefits and alternatives for the proposed anesthesia with the patient or authorized representative who has indicated his/her understanding and acceptance.     Dental advisory given  Plan Discussed with:   Anesthesia Plan Comments: (Lab Results      Component                Value               Date                      WBC                      12.4 (H)            06/12/2021                HGB                       12.0                06/12/2021                HCT                      37.0                06/12/2021                MCV                      90.2                06/12/2021                PLT                      261  06/12/2021           Lab Results      Component                Value               Date                      NA                       132 (L)             06/12/2021                K                        4.3                 06/12/2021                CO2                      23                  06/12/2021                GLUCOSE                  95                  06/12/2021                BUN                      7                   06/12/2021                CREATININE               0.61                06/12/2021                CALCIUM                  9.4                 06/12/2021                GFRNONAA                 >60                 06/12/2021          )        Anesthesia Quick Evaluation

## 2021-06-12 NOTE — Progress Notes (Addendum)
GYN Note Late entry for 1810  CTSP re: increased pain and new hypotension and tachycardia. Patient assessed and OR called and need for stat surgery and would do it via ex-lap. Patient walked from MAU to the OR.  See Op Note for details  Cornelia Copa MD Attending Center for [facility] (Faculty Practice) 06/12/2021 Time: 2003

## 2021-06-12 NOTE — MAU Note (Signed)
At 1747 Dr. Vergie Living notified of patients BP and change of clinical presentation. IV team at bedside to place 20G. At 1749 Dr. Vergie Living at bedside. Short stay notified of expedited transport. MD at bedside for transport. Erle Crocker, RN with MD for transport to OR 7. This RN retrieved 2 units of emergency released blood from blood bank per MD and taken to OR.

## 2021-06-12 NOTE — H&P (Addendum)
Obstetrics & Gynecology H&P   Date of Admission: 06/12/2021   Requesting Provider: Maternity Admissions Unit  Primary OBGYN: None  Reason for Admission: abdominal pain. 7wk right tubal ectopic with heartbeat  History of Present Illness: Whitney Hickman is a 20 y.o. G2P1011 (Patient's last menstrual period was 04/15/2021.), with the above CC. PMHx is significant for PID.  Hcg 70k with normal cbc. U/s showed right tubal ectopic with HR and  moderate free fluid. Last PO 0500 today. Last pain med was 0.5mg  of IV dilaudid at 1530  ROS: A 12-point review of systems was performed and negative, except as stated in the above HPI.  OBGYN History: As per HPI. OB History  Gravida Para Term Preterm AB Living  2 1 1  0 0 1  SAB IAB Ectopic Multiple Live Births  0 0 0   1    # Outcome Date GA Lbr Len/2nd Weight Sex Delivery Anes PTL Lv  2 Current           1 Term 11/25/16 [redacted]w[redacted]d 05:47 / 00:50 2741 g F Vag-Spont EPI  LIV     Past Medical History: Past Medical History:  Diagnosis Date   PID (acute pelvic inflammatory disease)     Past Surgical History: Past Surgical History:  Procedure Laterality Date   NO PAST SURGERIES      Family History:  History reviewed. No pertinent family history.   Social History:  Social History   Socioeconomic History   Marital status: Single    Spouse name: Not on file   Number of children: 1   Years of education: 12   Highest education level: High school graduate  Occupational History   Not on file  Tobacco Use   Smoking status: Some Days    Types: Cigars   Smokeless tobacco: Never  Vaping Use   Vaping Use: Never used  Substance and Sexual Activity   Alcohol use: Not Currently   Drug use: Not Currently    Types: Marijuana   Sexual activity: Not Currently  Other Topics Concern   Not on file  Social History Narrative   ** Merged History Encounter **       Social Determinants of Health   Financial Resource Strain: Not on file  Food  Insecurity: Not on file  Transportation Needs: Not on file  Physical Activity: Not on file  Stress: Not on file  Social Connections: Not on file  Intimate Partner Violence: Not on file   Allergy: No Known Allergies  Current Outpatient Medications: None   Physical Exam:   Current Vital Signs 24h Vital Sign Ranges  T 98.2 F (36.8 C) Temp  Avg: 98.2 F (36.8 C)  Min: 98.2 F (36.8 C)  Max: 98.2 F (36.8 C)  BP 125/70 BP  Min: 109/60  Max: 125/70  HR 79 Pulse  Avg: 84.5  Min: 79  Max: 90  RR 15 Resp  Avg: 16.5  Min: 15  Max: 18  SaO2 99 %   SpO2  Avg: 99 %  Min: 99 %  Max: 99 %       24 Hour I/O Current Shift I/O  Time Ins Outs No intake/output data recorded. No intake/output data recorded.   Patient Vitals for the past 24 hrs:  BP Temp Temp src Pulse Resp SpO2 Height Weight  06/12/21 1731 125/70 -- -- 79 15 99 % -- --  06/12/21 1427 109/60 98.2 F (36.8 C) Oral 90 18 99 % -- --  06/12/21  1423 -- -- -- -- -- -- 5' 3.5" (1.613 m) 57.7 kg    Body mass index is 22.18 kg/m. General appearance: Well nourished, well developed female in no acute distress.  Cardiovascular: S1, S2 normal, no murmur, rub or gallop, regular rate and rhythm Respiratory:  Clear to auscultation bilateral. Normal respiratory effort Abdomen: soft, nd. Mildly ttp, no peritoneal s/s. Neuro/Psych:  Normal mood and affect.  Skin:  Warm and dry.  Extremities: no clubbing, cyanosis, or edema.   Laboratory: Beta HCG: 75,488 Wet prep negative Uds negative Gc/ct pending  Recent Labs  Lab 06/12/21 1525  WBC 12.4*  HGB 12.0  HCT 37.0  PLT 261   Recent Labs  Lab 06/12/21 1525  NA 132*  K 4.3  CL 98  CO2 23  BUN 7  CREATININE 0.61  CALCIUM 9.4  PROT 7.1  BILITOT 0.9  ALKPHOS 43  ALT 13  AST 22  GLUCOSE 95   No results for input(s): APTT, INR, PTT in the last 168 hours.  Invalid input(s): DRHAPTT Recent Labs  Lab 06/12/21 1525  ABORH A POS    Imaging:  Narrative & Impression   CLINICAL DATA:  Abdominal pain.  Positive pregnancy test.   EXAM: OBSTETRIC <14 WK [place] AND TRANSVAGINAL OB US   TECHNIQUE: Both transabdominal and transvaginal ultrasound examinations were performed for complete evaluation of the gestation as well as the maternal uterus, adnexal regions, and pelvic cul-de-sac. Transvaginal technique was performed to assess early pregnancy.   COMPARISON:  None.   FINDINGS: Intrauterine gestational sac: None   Maternal uterus/adnexae: A living ectopic pregnancy is seen in the right adnexa. An embryo is seen which shows cardiac activity with a heart rate of 138 bpm. Crown-rump length measures 12 mm, which corresponds to a gestational age of [redacted] weeks 3 days.   The ovaries are not well visualized, and exam was technically difficult due to severe patient pain and inability to tolerate transducer pressure. A moderate amount of complex free fluid is seen, consistent with hemoperitoneum.   IMPRESSION: Living ectopic pregnancy in right adnexa, with cardiac activity and estimated gestational age of [redacted] weeks 3 days.   Moderate complex free fluid, consistent with hemoperitoneum.   Critical Value/emergent results were called by telephone at the time of interpretation on 06/12/2021 at 5:02 pm to provider [facility] , who verbally acknowledged these results.     Electronically Signed   By: Danae Orleans M.D.   On: 06/12/2021 17:03    Assessment: Whitney Hickman is a 20 y.o. [phone] with ectopc pregnancy; pt stable  Plan: D/w her recommendation for l/s right salpingectomy, which she is amenable to. I d/w her and support person that she should be able to go home after surgery tonight.   I d/w OR and they have a level 1 and potentially another one right now in the ED. Pt stable right now to not have to supersede other cases but urgency of ectopic d/w them.   Total time taking care of the patient was 30 minutes, with greater than 50% of the time spent in  face to face interaction with the patient.  Cornelia Copa MD Attending [phone] Center for [facility] Healthcare (Faculty Practice) GYN Consult Phone: [phone] (M-F, 0800-1700) & [phone] (Off hours, weekends, holidays)

## 2021-06-12 NOTE — Anesthesia Postprocedure Evaluation (Signed)
Anesthesia Post Note  Patient: Whitney Hickman  Procedure(s) Performed: EXPLORATORY  LAPAROSCOPY WITH REMOVAL OF ECTOPIC PREGNANCY (Right)     Patient location during evaluation: PACU Anesthesia Type: General Level of consciousness: awake and alert Pain management: pain level controlled Vital Signs Assessment: post-procedure vital signs reviewed and stable Respiratory status: spontaneous breathing, nonlabored ventilation, respiratory function stable and patient connected to nasal cannula oxygen Cardiovascular status: blood pressure returned to baseline and stable Postop Assessment: no apparent nausea or vomiting Anesthetic complications: no   No notable events documented.  Last Vitals:  Vitals:   06/12/21 2005 06/12/21 2044  BP: 132/63 128/70  Pulse: 71 66  Resp: 14 16  Temp:    SpO2: 99% 100%    Last Pain:  Vitals:   06/12/21 2005  TempSrc:   PainSc: 10-Worst pain ever                 Earl Lites P Jonahtan Manseau

## 2021-06-12 NOTE — Brief Op Note (Signed)
06/12/2021  7:18 PM  PATIENT:  Whitney Hickman  20 y.o. female  PRE-OPERATIVE DIAGNOSIS:  right ectopic pregnancy, ruptured, hypotensive, tachycardic  POST-OPERATIVE DIAGNOSIS:  same  PROCEDURE:  ex-lap, right salpingectomy  SURGEON:  Surgeon(s) and Role:    * Waukee Bing, MD - Primary  ASSISTANTS: none   IVF: crystalloid  ANESTHESIA:   general  EBL:   BLOOD ADMINISTERED:2U PRBC  DRAINS:  indwelling foley UOP    LOCAL MEDICATIONS USED:  NONE  SPECIMEN:  right fallopian tube  DISPOSITION OF SPECIMEN:  PATHOLOGY  COUNTS:  NO , x ray done and negative  TOURNIQUET:  * No tourniquets in log *  DICTATION: .Note written in EPIC  PLAN OF CARE: Admit for overnight observation  PATIENT DISPOSITION:  PACU - hemodynamically stable.   Delay start of Pharmacological VTE agent (>24hrs) due to surgical blood loss or risk of bleeding: not applicable

## 2021-06-12 NOTE — Op Note (Signed)
Operative Note   06/12/2021  PRE-OP DIAGNOSIS *Right ectopic pregnancy with fetal heart rate *New onset hypotension, tachycardia and increased RLQ pain *Ruptured ectopic pregnancy   POST-OP DIAGNOSIS *Same   SURGEON: Surgeon(s) and Role:    * Oneida Bing, MD - Primary  ASSISTANT: None  PROCEDURE: Exploratory laparotomy via Claretha Cooper incision, right salpingectomy  ANESTHESIA: General and local  ESTIMATED BLOOD LOSS:   DRAINS: UOP via foley catheter   TOTAL IV FLUIDS: crystalloid. 2U PRBC intraoperatively  VTE PROPHYLAXIS: SCDs to the bilateral lower extremities  ANTIBIOTICS: not indicated  SPECIMENS: right fallopian tube  DISPOSITION: PACU - hemodynamically stable.  CONDITION: stable  COMPLICATIONS: Need for stat procedure  FINDINGS: Large amount of hemoperitoneum. There was a large ruptured ectopic pregnancy at the right isthmus of the fallopian tube. Normal appearing right ovary.  Filmy adhesions of the left adnexa consistent with prior PID; I could not see the infundibulum because it was adhesed to the bowel in that area and very deep in the lower pelvis but the ovary and tube palpated normal on the left side. Grossly normal appearing uterus.   DESCRIPTION OF PROCEDURE: After informed consent was obtained, the patient was prepped and draped in the usual sterile manner for an abdominal procedure and placed in the dorsal supine position after placement of foley catheter.  A time was done, and a Claretha Cooper incision was made into the abdomen.  Once inside the abdominal cavity, a self-retaining Alexis retractor was placed to expose the pelvic cavity with moist lap sponges. The uterus was then identified and the right fallopian tube insertion was cored out of the myometrium of the uterus with the bovie until I was able to place a Kelly clamp across the remnant. It was then cut and suture ligated with a fore and aft stitch of 1-0 vicryl. The cored out  section of the right cornua was then sewn closed in layers with 1-0 vicryl. The right mesosalpinx serosa was then closed with running stitch of 1-0 vicryl.  The abdomen was then thoroughly irrigated and all clots removed.  The fascia was closed with #0 Vicryl in a running continuous manner and the subcutaneous tissue was also closed with #2-0 plain gut with interrupted sutures and the skin was closed with 4-0 monocryl and steri strips placed.    Hemostasis was secured throughout the entire layers. X ray done due to urgent nature of the case and count not taken and the x ray was negative.  The patient was taken to the recovery room in good condition and breathing independently.  I did not breach the myometrium so the patient should be fine for a vaginal delivery in the future.   Cornelia Copa MD Attending Center for [employer] [facility])

## 2021-06-12 NOTE — MAU Note (Signed)
Whitney Hickman is a 20 y.o. here in MAU reporting: states she has not been able to pee or poop. States it hurts when she pees and gets back chest pains. Had a very small BM this AM but since then it had been 2-3 days. No vaginal bleeding or discharge.   LMP: 04/15/21  Onset of complaint: ongoing  Pain score: abdomen 10/10, chest 7/10  Vitals:   06/12/21 1427  BP: 109/60  Pulse: 90  Resp: 18  Temp: 98.2 F (36.8 C)  SpO2: 99%     Lab orders placed from triage: upt

## 2021-06-12 NOTE — MAU Provider Note (Signed)
History     CSN: [id number]  Arrival date and time: 06/12/21 1408   Event Date/Time   First Provider Initiated Contact with Patient 06/12/21 1705      Chief Complaint  Patient presents with   Constipation   Abdominal Pain   Chest Pain   HPI Whitney Hickman is a 20 y.o. G2P1001 at [redacted]w[redacted]d by LMP who presents to MAU complaints for abdominal pain, rectal pressure, constipation and chest pain for the past 4 days. Patient's abdominal pain is generalized to her lower abdomen. Pain score 7-10/10. She denies aggravating or alleviating factors. She has not taken medication or tried other treatments for this complaint.  Patient had [facility] ob appointment at [facility] yesterday.  OB History     Gravida  2   Para  1   Term  1   Preterm  0   AB  0   Living  1      SAB  0   IAB  0   Ectopic  0   Multiple      Live Births  1           Past Medical History:  Diagnosis Date   Medical history non-contributory     Past Surgical History:  Procedure Laterality Date   NO PAST SURGERIES      History reviewed. No pertinent family history.  Social History   Tobacco Use   Smoking status: Some Days    Types: Cigars   Smokeless tobacco: Never  Vaping Use   Vaping Use: Never used  Substance Use Topics   Alcohol use: Not Currently   Drug use: Not Currently    Types: Marijuana    Allergies: No Known Allergies  Medications Prior to Admission  Medication Sig Dispense Refill Last Dose   Prenatal Vit-Fe Fumarate-FA (MULTIVITAMIN-PRENATAL) 27-0.8 MG TABS tablet Take 1 tablet by mouth daily at 12 noon.   06/11/2021   acetaminophen (TYLENOL) 325 MG tablet Take 650 mg by mouth every 6 (six) hours as needed for mild pain or fever.      doxycycline (VIBRA-TABS) 100 MG tablet Take 1 tablet (100 mg total) by mouth every 12 (twelve) hours. 24 tablet 0    ibuprofen (ADVIL) 800 MG tablet Take 1 tablet (800 mg total) by mouth every 8 (eight) hours as needed (mild pain). 30 tablet 0     oxyCODONE (OXY IR/ROXICODONE) 5 MG immediate release tablet Take 1-2 tablets (5-10 mg total) by mouth every 6 (six) hours as needed for severe pain. (Patient not taking: Reported on 04/02/2020) 15 tablet 0     Review of Systems  Constitutional:  Positive for fatigue.  Respiratory:  Positive for chest tightness and shortness of breath.   Gastrointestinal:  Positive for abdominal pain.  All other systems reviewed and are negative. Physical Exam   Blood pressure 109/60, pulse 90, temperature 98.2 F (36.8 C), temperature source Oral, resp. rate 18, height 5' 3.5" (1.613 m), weight 57.7 kg, last menstrual period 04/15/2021, SpO2 99 %, unknown if currently breastfeeding.  Physical Exam Vitals and nursing note reviewed. Exam conducted with a chaperone present.  Constitutional:      Appearance: She is ill-appearing.  Cardiovascular:     Rate and Rhythm: Normal rate.     Heart sounds: Normal heart sounds.     Comments: Intermittent tachycardia due to pain  Pulmonary:     Effort: Pulmonary effort is normal.     Breath sounds: Normal breath sounds.  Abdominal:  Palpations: Abdomen is soft.     Tenderness: There is generalized abdominal tenderness.  Skin:    Capillary Refill: Capillary refill takes less than 2 seconds.  Neurological:     Mental Status: She is oriented to person, place, and time.    MAU Course  Procedures --Dr. Donavan Foil notified of live ectopic at 1650. Dr. Vergie Living inbound to unit for discussion of operative procedure  Results for orders placed or performed during the hospital encounter of 06/12/21 (from the past 24 hour(s))  Pregnancy, urine POC     Status: Abnormal   Collection Time: 06/12/21  2:38 PM  Result Value Ref Range   Preg Test, Ur POSITIVE (A) NEGATIVE  Urinalysis, Routine w reflex microscopic Urine, Clean Catch     Status: Abnormal   Collection Time: 06/12/21  2:49 PM  Result Value Ref Range   Color, Urine YELLOW YELLOW   APPearance HAZY (A) CLEAR    Specific Gravity, Urine 1.008 1.005 - 1.030   pH 6.0 5.0 - 8.0   Glucose, UA NEGATIVE NEGATIVE mg/dL   Hgb urine dipstick NEGATIVE NEGATIVE   Bilirubin Urine NEGATIVE NEGATIVE   Ketones, ur NEGATIVE NEGATIVE mg/dL   Protein, ur NEGATIVE NEGATIVE mg/dL   Nitrite NEGATIVE NEGATIVE   Leukocytes,Ua NEGATIVE NEGATIVE  Rapid urine drug screen (hospital performed)     Status: None   Collection Time: 06/12/21  2:57 PM  Result Value Ref Range   Opiates NONE DETECTED NONE DETECTED   Cocaine NONE DETECTED NONE DETECTED   Benzodiazepines NONE DETECTED NONE DETECTED   Amphetamines NONE DETECTED NONE DETECTED   Tetrahydrocannabinol NONE DETECTED NONE DETECTED   Barbiturates NONE DETECTED NONE DETECTED  Wet prep, genital     Status: None   Collection Time: 06/12/21  2:59 PM   Specimen: Vaginal  Result Value Ref Range   Yeast Wet Prep HPF POC NONE SEEN NONE SEEN   Trich, Wet Prep NONE SEEN NONE SEEN   Clue Cells Wet Prep HPF POC NONE SEEN NONE SEEN   WBC, Wet Prep HPF POC <10 <10   Sperm NONE SEEN   CBC with Differential/Platelet     Status: Abnormal   Collection Time: 06/12/21  3:25 PM  Result Value Ref Range   WBC 12.4 (H) 4.0 - 10.5 K/uL   RBC 4.10 3.87 - 5.11 MIL/uL   Hemoglobin 12.0 12.0 - 15.0 g/dL   HCT 37.1 06.2 - 69.4 %   MCV 90.2 80.0 - 100.0 fL   MCH 29.3 26.0 - 34.0 pg   MCHC 32.4 30.0 - 36.0 g/dL   RDW 85.4 62.7 - 03.5 %   Platelets 261 150 - 400 K/uL   nRBC 0.0 0.0 - 0.2 %   Neutrophils Relative % 86 %   Neutro Abs 10.7 (H) 1.7 - 7.7 K/uL   Lymphocytes Relative 9 %   Lymphs Abs 1.2 0.7 - 4.0 K/uL   Monocytes Relative 4 %   Monocytes Absolute 0.5 0.1 - 1.0 K/uL   Eosinophils Relative 0 %   Eosinophils Absolute 0.0 0.0 - 0.5 K/uL   Basophils Relative 0 %   Basophils Absolute 0.0 0.0 - 0.1 K/uL   Immature Granulocytes 1 %   Abs Immature Granulocytes 0.06 0.00 - 0.07 K/uL  Comprehensive metabolic panel     Status: Abnormal   Collection Time: 06/12/21  3:25 PM  Result  Value Ref Range   Sodium 132 (L) 135 - 145 mmol/L   Potassium 4.3 3.5 - 5.1 mmol/L  Chloride 98 98 - 111 mmol/L   CO2 23 22 - 32 mmol/L   Glucose, Bld 95 70 - 99 mg/dL   BUN 7 6 - 20 mg/dL   Creatinine, Ser 7.00 0.44 - 1.00 mg/dL   Calcium 9.4 8.9 - 17.4 mg/dL   Total Protein 7.1 6.5 - 8.1 g/dL   Albumin 3.9 3.5 - 5.0 g/dL   AST 22 15 - 41 U/L   ALT 13 0 - 44 U/L   Alkaline Phosphatase 43 38 - 126 U/L   Total Bilirubin 0.9 0.3 - 1.2 mg/dL   GFR, Estimated >94 >49 mL/min   Anion gap 11 5 - 15  hCG, quantitative, pregnancy     Status: Abnormal   Collection Time: 06/12/21  3:25 PM  Result Value Ref Range   hCG, Beta Chain, Quant, S 75,488 (H) <5 mIU/mL  Type and screen [facility]     Status: None   Collection Time: 06/12/21  3:25 PM  Result Value Ref Range   ABO/RH(D) A POS    Antibody Screen NEG    Sample Expiration      06/15/2021,2359 Performed at [facility], [address] [place], [place] [address]    US OB LESS THAN 14 WEEKS WITH [place] TRANSVAGINAL  Result Date: 06/12/2021 CLINICAL DATA:  Abdominal pain.  Positive pregnancy test. EXAM: OBSTETRIC <14 WK [place] AND TRANSVAGINAL OB US TECHNIQUE: Both transabdominal and transvaginal ultrasound examinations were performed for complete evaluation of the gestation as well as the maternal uterus, adnexal regions, and pelvic cul-de-sac. Transvaginal technique was performed to assess early pregnancy. COMPARISON:  None. FINDINGS: Intrauterine gestational sac: None Maternal uterus/adnexae: A living ectopic pregnancy is seen in the right adnexa. An embryo is seen which shows cardiac activity with a heart rate of 138 bpm. Crown-rump length measures 12 mm, which corresponds to a gestational age of [redacted] weeks 3 days. The ovaries are not well visualized, and exam was technically difficult due to severe patient pain and inability to tolerate transducer pressure. A moderate amount of complex free fluid is seen, consistent with  hemoperitoneum. IMPRESSION: Living ectopic pregnancy in right adnexa, with cardiac activity and estimated gestational age of [redacted] weeks 3 days. Moderate complex free fluid, consistent with hemoperitoneum. Critical Value/emergent results were called by telephone at the time of interpretation on 06/12/2021 at 5:02 pm to provider [facility] , who verbally acknowledged these results. Electronically Signed   By: Danae Orleans M.D.   On: 06/12/2021 17:03     Assessment and Plan  -20 y.o. G2P1001 at [redacted]w[redacted]d  --Right live ectopic pregnancy --Taken emergently to the OR by Dr. Tennis Must, MSA, MSN, CNM Certified Nurse Midwife, Faculty Practice Center for [facility], [facility]

## 2021-06-13 ENCOUNTER — Encounter (HOSPITAL_COMMUNITY): Payer: Self-pay | Admitting: Obstetrics and Gynecology

## 2021-06-13 LAB — TYPE AND SCREEN
ABO/RH(D): A POS
Antibody Screen: NEGATIVE
Unit division: 0
Unit division: 0

## 2021-06-13 LAB — BPAM RBC
Blood Product Expiration Date: 202212092359
Blood Product Expiration Date: 202212092359
ISSUE DATE / TIME: 202211171756
ISSUE DATE / TIME: 202211171756
Unit Type and Rh: 6200
Unit Type and Rh: 6200

## 2021-06-13 LAB — COMPREHENSIVE METABOLIC PANEL
ALT: 13 U/L (ref 0–44)
AST: 18 U/L (ref 15–41)
Albumin: 2.8 g/dL — ABNORMAL LOW (ref 3.5–5.0)
Alkaline Phosphatase: 31 U/L — ABNORMAL LOW (ref 38–126)
Anion gap: 8 (ref 5–15)
BUN: 6 mg/dL (ref 6–20)
CO2: 24 mmol/L (ref 22–32)
Calcium: 8.7 mg/dL — ABNORMAL LOW (ref 8.9–10.3)
Chloride: 101 mmol/L (ref 98–111)
Creatinine, Ser: 0.67 mg/dL (ref 0.44–1.00)
GFR, Estimated: >60 mL/min (ref >60)
Glucose, Bld: 148 mg/dL — ABNORMAL HIGH (ref 70–99)
Potassium: 4.1 mmol/L (ref 3.5–5.1)
Sodium: 133 mmol/L — ABNORMAL LOW (ref 135–145)
Total Bilirubin: 0.9 mg/dL (ref 0.3–1.2)
Total Protein: 5.1 g/dL — ABNORMAL LOW (ref 6.5–8.1)

## 2021-06-13 LAB — HEMOGLOBIN AND HEMATOCRIT, BLOOD
HCT: 25 % — ABNORMAL LOW (ref 36.0–46.0)
Hemoglobin: 8.9 g/dL — ABNORMAL LOW (ref 12.0–15.0)

## 2021-06-13 LAB — CBC
HCT: 29.5 % — ABNORMAL LOW (ref 36.0–46.0)
Hemoglobin: 10.1 g/dL — ABNORMAL LOW (ref 12.0–15.0)
MCH: 27.9 pg (ref 26.0–34.0)
MCHC: 34.2 g/dL (ref 30.0–36.0)
MCV: 81.5 fL (ref 80.0–100.0)
Platelets: 183 10*3/uL (ref 150–400)
RBC: 3.62 MIL/uL — ABNORMAL LOW (ref 3.87–5.11)
RDW: 16.2 % — ABNORMAL HIGH (ref 11.5–15.5)
WBC: 16.3 10*3/uL — ABNORMAL HIGH (ref 4.0–10.5)
nRBC: 0 % (ref 0.0–0.2)

## 2021-06-13 LAB — RAPID HIV SCREEN (HIV 1/2 AB+AG)
HIV 1/2 Antibodies: NONREACTIVE
HIV-1 P24 Antigen - HIV24: NONREACTIVE

## 2021-06-13 LAB — RPR: RPR Ser Ql: NONREACTIVE

## 2021-06-13 LAB — GC/CHLAMYDIA PROBE AMP (~~LOC~~) NOT AT ARMC
Chlamydia: NEGATIVE
Comment: NEGATIVE
Comment: NORMAL
Neisseria Gonorrhea: NEGATIVE

## 2021-06-13 LAB — HEPATITIS B SURFACE ANTIGEN: Hepatitis B Surface Ag: NONREACTIVE

## 2021-06-13 LAB — HEPATITIS C ANTIBODY: HCV Ab: NONREACTIVE

## 2021-06-13 MED ORDER — MORPHINE SULFATE (PF) 2 MG/ML IV SOLN
2.0000 mg | Freq: Once | INTRAVENOUS | Status: AC
  Administered 2021-06-13: 2 mg via INTRAVENOUS
  Filled 2021-06-13: qty 1

## 2021-06-13 MED ORDER — KETOROLAC TROMETHAMINE 30 MG/ML IJ SOLN
30.0000 mg | Freq: Four times a day (QID) | INTRAMUSCULAR | Status: DC
  Administered 2021-06-13 – 2021-06-14 (×5): 30 mg via INTRAVENOUS
  Filled 2021-06-13 (×5): qty 1

## 2021-06-13 MED ORDER — BENZOCAINE 10 % MT GEL
Freq: Three times a day (TID) | OROMUCOSAL | Status: DC | PRN
  Filled 2021-06-13: qty 9

## 2021-06-13 MED ORDER — LACTATED RINGERS IV BOLUS
500.0000 mL | Freq: Once | INTRAVENOUS | Status: AC
  Administered 2021-06-13: 500 mL via INTRAVENOUS

## 2021-06-13 MED ORDER — ALUM & MAG HYDROXIDE-SIMETH 200-200-20 MG/5ML PO SUSP
30.0000 mL | ORAL | Status: DC | PRN
  Administered 2021-06-13 (×2): 30 mL via ORAL
  Filled 2021-06-13 (×2): qty 30

## 2021-06-13 NOTE — Progress Notes (Signed)
1 Day Post-Op Procedure(s) (LRB): EXPLORATORY  LAPAROSCOPY WITH REMOVAL OF ECTOPIC PREGNANCY (Right)  Subjective: Patient reports incisional pain, tolerating PO, and no problems voiding.    Objective: I have reviewed patient's vital signs and labs.  General: alert, cooperative, and no distress GI: soft, non-tender; bowel sounds normal; no masses,  no organomegaly and incision: clean, dry, and intact Extremities: extremities normal, atraumatic, no cyanosis or edema CBC    Component Value Date/Time   WBC 16.3 (H) 06/13/2021 0440   RBC 3.62 (L) 06/13/2021 0440   HGB 10.1 (L) 06/13/2021 0440   HCT 29.5 (L) 06/13/2021 0440   PLT 183 06/13/2021 0440   MCV 81.5 06/13/2021 0440   MCH 27.9 06/13/2021 0440   MCHC 34.2 06/13/2021 0440   RDW 16.2 (H) 06/13/2021 0440   LYMPHSABS 1.2 06/12/2021 1525   MONOABS 0.5 06/12/2021 1525   EOSABS 0.0 06/12/2021 1525   BASOSABS 0.0 06/12/2021 1525    Assessment: s/p Procedure(s): EXPLORATORY  LAPAROSCOPY WITH REMOVAL OF ECTOPIC PREGNANCY (Right): stable and tolerating diet  Plan: Encourage ambulation Advance to PO medication  LOS: 1 day    Scheryl Darter 06/13/2021, 9:18 AM

## 2021-06-14 MED ORDER — OXYCODONE HCL 5 MG PO TABS: 5.0000 mg | ORAL_TABLET | ORAL | 0 refills | Status: DC | PRN

## 2021-06-14 MED ORDER — IBUPROFEN 600 MG PO TABS: 600.0000 mg | ORAL_TABLET | Freq: Four times a day (QID) | ORAL | 3 refills | Status: DC | PRN

## 2021-06-14 NOTE — Progress Notes (Signed)
Pt discharged via wheelchair accompanied by nurse tech in stable condition. Pt sent home with all belongings.

## 2021-06-14 NOTE — Discharge Summary (Signed)
Gynecology Physician Postoperative Discharge Summary  Patient ID: Whitney Hickman MRN: [id number] DOB/AGE: 20/07/2000 20 y.o.  Admit Date: 06/12/2021 Discharge Date: 06/14/2021  Preoperative Diagnoses: ruptured tubal ectopic pregnancy  Procedures: Procedure(s) (LRB): EXPLORATORY  LAPAROSCOPY WITH REMOVAL OF ECTOPIC PREGNANCY (Right) laparotomy Hospital Course:  Whitney Hickman is a 20 y.o. G2P1001  admitted for scheduled surgery.  She underwent the procedures as mentioned above, her operation was uncomplicated. For further details about surgery, please refer to the operative report. Patient had an uncomplicated postoperative course. By time of discharge on POD#2, her pain was controlled on oral pain medications; she was ambulating, voiding without difficulty, tolerating regular diet and passing flatus. She was deemed stable for discharge to home.   Significant Labs: CBC Latest Ref Rng & Units 06/13/2021 06/13/2021 06/12/2021  WBC 4.0 - 10.5 K/uL - 16.3(H) 12.4(H)  Hemoglobin 12.0 - 15.0 g/dL 4.6(N) 10.1(L) 12.0  Hematocrit 36.0 - 46.0 % 25.0(L) 29.5(L) 37.0  Platelets 150 - 400 K/uL - 183 261    Discharge Exam: Blood pressure (!) 111/54, pulse 88, temperature 98.3 F (36.8 C), temperature source Oral, resp. rate 16, height 5' 3.5" (1.613 m), weight 57.7 kg, last menstrual period 04/15/2021, SpO2 99 %, unknown if currently breastfeeding. General appearance: alert and no distress  Resp: clear to auscultation bilaterally  Cardio: regular rate and rhythm  GI: soft, mildly tender; bowel sounds normal; no masses, no organomegaly.  Incision: C/D/I, no erythema, no drainage noted Pelvic: scant blood on pad  Extremities: extremities normal, atraumatic, no cyanosis or edema and Homans sign is negative, no sign of DVT  Discharged Condition: Stable  Disposition: Discharge disposition: 01-Home or Self Care       Discharge Instructions     Discharge patient   Complete by: As directed     Discharge disposition: 01-Home or Self Care   Discharge patient date: 06/14/2021      Allergies as of 06/14/2021   No Known Allergies      Medication List     STOP taking these medications    acetaminophen 325 MG tablet Commonly known as: TYLENOL   doxycycline 100 MG tablet Commonly known as: VIBRA-TABS       TAKE these medications    ibuprofen 600 MG tablet Commonly known as: ADVIL Take 1 tablet (600 mg total) by mouth every 6 (six) hours as needed. What changed:  medication strength how much to take when to take this reasons to take this   multivitamin-prenatal 27-0.8 MG Tabs tablet Take 1 tablet by mouth daily at 12 noon.   oxyCODONE 5 MG immediate release tablet Commonly known as: Oxy IR/ROXICODONE Take 1-2 tablets (5-10 mg total) by mouth every 4 (four) hours as needed for moderate pain. What changed:  when to take this reasons to take this        Follow-up Information     Center for [facility] Follow up in 3 week(s).   Specialty: Obstetrics and Gynecology Contact information: [address] [place] [id number] [phone]                Signed: Adam Phenix, MD, FACOG Obstetrician & Gynecologist Faculty Practice, [facility]

## 2021-06-14 NOTE — Progress Notes (Signed)
Pt given discharge instructions. Pt verbalized understanding and all questions answered. IV discontinued. Pt awaiting ride home.

## 2021-06-16 LAB — SURGICAL PATHOLOGY

## 2021-06-28 ENCOUNTER — Encounter: Payer: Self-pay | Admitting: Radiology

## 2021-07-14 ENCOUNTER — Ambulatory Visit (INDEPENDENT_AMBULATORY_CARE_PROVIDER_SITE_OTHER): Payer: Medicaid Other | Admitting: Obstetrics and Gynecology

## 2021-07-14 ENCOUNTER — Other Ambulatory Visit (HOSPITAL_COMMUNITY)
Admission: RE | Admit: 2021-07-14 | Discharge: 2021-07-14 | Disposition: A | Discharge status: Home / Self Care | Payer: Medicaid Other | Source: Ambulatory Visit | Attending: Obstetrics and Gynecology | Admitting: Obstetrics and Gynecology

## 2021-07-14 ENCOUNTER — Encounter: Payer: Self-pay | Admitting: Obstetrics and Gynecology

## 2021-07-14 ENCOUNTER — Other Ambulatory Visit: Payer: Self-pay

## 2021-07-14 VITALS — BP 118/63 | HR 97 | Wt 126.7 lb

## 2021-07-14 DIAGNOSIS — N898 Other specified noninflammatory disorders of vagina: Secondary | ICD-10-CM | POA: Insufficient documentation

## 2021-07-14 DIAGNOSIS — Z8759 Personal history of other complications of pregnancy, childbirth and the puerperium: Secondary | ICD-10-CM

## 2021-07-14 MED ORDER — GABAPENTIN 300 MG PO CAPS: 300.0000 mg | ORAL_CAPSULE | Freq: Two times a day (BID) | ORAL | 0 refills | Status: DC | PRN

## 2021-07-14 NOTE — Progress Notes (Signed)
Obstetrics and Gynecology Visit Return Patient Evaluation  Appointment Date: 07/14/2021   OBGYN Clinic: [facility]  Chief Complaint: post op check  History of Present Illness:  Whitney Hickman is a 20 y.o. [phone] with above CC. Patient is s/p stat ex-lap/right salpingectomy on 11/17 for ruptured ectopic. She was discharged to home on pod#2.  Interval History: Since that time, she states that she is doing well. No period yet. She is having some vaginal discharge and she has had sexual intercourse since surgery. Only complaint is she has occasional burning sensation around the incision.   Review of Systems: as noted in the History of Present Illness.   Medications:  Whitney Hickman had no medications administered during this visit. Current Outpatient Medications  Medication Sig Dispense Refill   ibuprofen (ADVIL) 600 MG tablet Take 1 tablet (600 mg total) by mouth every 6 (six) hours as needed. 60 tablet 3   oxyCODONE (OXY IR/ROXICODONE) 5 MG immediate release tablet Take 1-2 tablets (5-10 mg total) by mouth every 4 (four) hours as needed for moderate pain. (Patient not taking: Reported on 07/14/2021) 30 tablet 0   Prenatal Vit-Fe Fumarate-FA (MULTIVITAMIN-PRENATAL) 27-0.8 MG TABS tablet Take 1 tablet by mouth daily at 12 noon. (Patient not taking: Reported on 07/14/2021)     No current facility-administered medications for this visit.    Allergies: has No Known Allergies.  Physical Exam:  BP 118/63    Pulse 97    Wt 126 lb 11.2 oz (57.5 kg)    LMP 04/15/2021    Breastfeeding Unknown    BMI 22.09 kg/m  Body mass index is 22.09 kg/m. General appearance: Well nourished, well developed female in no acute distress.  Abdomen: diffusely non tender to palpation, non distended, and no masses, hernias. Incision c/d/i Neuro/Psych:  Normal mood and affect.     Assessment: pt stable  Plan:  1. Vaginal discharge Pt to do self swab today -  Cervicovaginal ancillary only( [place])  2. Post op I told her I recommend waiting at least six months before trying for pregnancy. Birth control options d/w her and she doesn't want to do anything for right now. I told her if she changes her mind to let us know as we can phone in options for her w/o her needing to come in for a visit. If pt gets pregnant in the future, she is to let us know asap to follow pregnancy early to rule out ectopic. Pt told okay to switch to regular multivitamin as long as it has folic acid. Gabapentin prn sent in as discomfort around the incision is likely nerves trying to heal.   Will f/u pt in a few months for an annual and I told her she'll need to start paps at age 31 RTC: 3 months  Cornelia Copa MD Attending Center for [employer] [facility])

## 2021-07-15 ENCOUNTER — Encounter: Payer: Self-pay | Admitting: Obstetrics and Gynecology

## 2021-07-15 DIAGNOSIS — Z8759 Personal history of other complications of pregnancy, childbirth and the puerperium: Secondary | ICD-10-CM | POA: Insufficient documentation

## 2021-07-15 LAB — CERVICOVAGINAL ANCILLARY ONLY
Bacterial Vaginitis (gardnerella): POSITIVE — AB
Candida Glabrata: NEGATIVE
Candida Vaginitis: NEGATIVE
Chlamydia: NEGATIVE
Comment: NEGATIVE
Comment: NEGATIVE
Comment: NEGATIVE
Comment: NEGATIVE
Comment: NEGATIVE
Comment: NORMAL
Neisseria Gonorrhea: NEGATIVE
Trichomonas: NEGATIVE

## 2021-07-15 MED ORDER — METRONIDAZOLE 500 MG PO TABS: 500.0000 mg | ORAL_TABLET | Freq: Two times a day (BID) | ORAL | 0 refills | Status: AC

## 2021-07-15 NOTE — Addendum Note (Signed)
Addended by: Homewood Bing on: 07/15/2021 12:52 PM   Modules accepted: Orders

## 2021-12-08 ENCOUNTER — Ambulatory Visit (HOSPITAL_COMMUNITY)
Admission: EM | Admit: 2021-12-08 | Discharge: 2021-12-08 | Disposition: A | Discharge status: Home / Self Care | Payer: Medicaid Other | Attending: Student | Admitting: Student

## 2021-12-08 ENCOUNTER — Encounter (HOSPITAL_COMMUNITY): Payer: Self-pay

## 2021-12-08 DIAGNOSIS — Z3202 Encounter for pregnancy test, result negative: Secondary | ICD-10-CM | POA: Diagnosis not present

## 2021-12-08 DIAGNOSIS — N760 Acute vaginitis: Secondary | ICD-10-CM | POA: Diagnosis not present

## 2021-12-08 LAB — POC URINE PREG, ED: Preg Test, Ur: NEGATIVE

## 2021-12-08 MED ORDER — ALBUTEROL SULFATE HFA 108 (90 BASE) MCG/ACT IN AERS
1.0000 | INHALATION_SPRAY | Freq: Four times a day (QID) | RESPIRATORY_TRACT | 0 refills | Status: DC | PRN
Start: 2021-12-08 — End: 2022-06-28

## 2021-12-08 NOTE — ED Provider Notes (Signed)
?[facility] ? ? ? ?CSN: [id number] ?Arrival date & time: 12/08/21  1122 ? ? ?  ? ?History   ?Chief Complaint ?Chief Complaint  ?Patient presents with  ? Exposure to STD  ? Vaginal Itching  ? Asthma  ? ? ?HPI ?Whitney Hickman is a 21 y.o. female presenting with vaginal irritation for about 3 weeks that has improved on its own.  History ectopic pregnancy and PID, she is status post removal 1 fallopian tube.  She states that she had some cold sore bumps in her genital area, these actually resolved on their own, but she is still having some discomfort with intercourse.  She denies other symptoms including vaginal odor, vaginal discharge, abdominal pain, flank pain, fever/chills.  States that she is sexually active with 1 female partner only.  She states that she did previously have a cold sore on her face, but never in the genital area before. LMP 11/12/21, she is not late for period but is requesting a pregnancy test. ? ? ?HPI ? ?Past Medical History:  ?Diagnosis Date  ? PID (acute pelvic inflammatory disease)   ? ? ?Patient Active Problem List  ? Diagnosis Date Noted  ? History of ectopic pregnancy 07/15/2021  ? ? ?Past Surgical History:  ?Procedure Laterality Date  ? DIAGNOSTIC LAPAROSCOPY WITH REMOVAL OF ECTOPIC PREGNANCY Right 06/12/2021  ? Procedure: EXPLORATORY  LAPAROSCOPY WITH REMOVAL OF ECTOPIC PREGNANCY;  Surgeon: [place] Bing, MD;  Location: [facility];  Service: Gynecology;  Laterality: Right;  ? NO PAST SURGERIES    ? ? ?OB History   ? ? Gravida  ?2  ? Para  ?1  ? Term  ?1  ? Preterm  ?0  ? AB  ?1  ? Living  ?1  ?  ? ? SAB  ?0  ? IAB  ?0  ? Ectopic  ?1  ? Multiple  ?   ? Live Births  ?1  ?   ?  ?  ? ? ? ?Home Medications   ? ?Prior to Admission medications   ?Medication Sig Start Date End Date Taking? Authorizing Provider  ?albuterol (VENTOLIN HFA) 108 (90 Base) MCG/ACT inhaler Inhale 1-2 puffs into the lungs every 6 (six) hours as needed for wheezing or shortness of breath. 12/08/21  Yes Rhys Martini, PA-C  ?gabapentin (NEURONTIN) 300 MG capsule Take 1 capsule (300 mg total) by mouth 2 (two) times daily as needed (nerve pain at the incision). 07/14/21   Monroe Bing, MD  ?ibuprofen (ADVIL) 600 MG tablet Take 1 tablet (600 mg total) by mouth every 6 (six) hours as needed. 06/14/21   Adam Phenix, MD  ? ? ?Family History ?History reviewed. No pertinent family history. ? ?Social History ?Social History  ? ?Tobacco Use  ? Smoking status: Some Days  ?  Types: Cigars  ? Smokeless tobacco: Never  ?Vaping Use  ? Vaping Use: Never used  ?Substance Use Topics  ? Alcohol use: Not Currently  ? Drug use: Not Currently  ?  Types: Marijuana  ? ? ? ?Allergies   ?Patient has no known allergies. ? ? ?Review of Systems ?Review of Systems  ?Constitutional:  Negative for chills and fever.  ?HENT:  Negative for sore throat.   ?Eyes:  Negative for pain and redness.  ?Respiratory:  Negative for shortness of breath.   ?Cardiovascular:  Negative for chest pain.  ?Gastrointestinal:  Negative for abdominal pain, diarrhea, nausea and vomiting.  ?Genitourinary:  Positive for genital sores. Negative  for decreased urine volume, difficulty urinating, dysuria, flank pain, frequency, hematuria, urgency, vaginal bleeding, vaginal discharge and vaginal pain.  ?Musculoskeletal:  Negative for back pain.  ?Skin:  Negative for rash.  ?All other systems reviewed and are negative. ? ? ?Physical Exam ?Triage Vital Signs ?ED Triage Vitals [12/08/21 1311]  ?Enc Vitals Group  ?   BP 119/70  ?   Pulse Rate 77  ?   Resp 16  ?   Temp 98.2 ?F (36.8 ?C)  ?   Temp Source Oral  ?   SpO2 98 %  ?   Weight   ?   Height   ?   Head Circumference   ?   Peak Flow   ?   Pain Score   ?   Pain Loc   ?   Pain Edu?   ?   Excl. in [place]?   ? ?No data found. ? ?Updated Vital Signs ?BP 119/70 (BP Location: Right Arm)   Pulse 77   Temp 98.2 ?F (36.8 ?C) (Oral)   Resp 16   LMP 11/12/2021   SpO2 98%  ? ?Visual Acuity ?Right Eye Distance:   ?Left Eye Distance:   ?Bilateral  Distance:   ? ?Right Eye Near:   ?Left Eye Near:    ?Bilateral Near:    ? ?Physical Exam ?Vitals reviewed.  ?Constitutional:   ?   General: She is not in acute distress. ?   Appearance: Normal appearance. She is not ill-appearing.  ?HENT:  ?   Head: Normocephalic and atraumatic.  ?   Mouth/Throat:  ?   Mouth: Mucous membranes are moist.  ?   Comments: Moist mucous membranes ?Eyes:  ?   Extraocular Movements: Extraocular movements intact.  ?   Pupils: Pupils are equal, round, and reactive to light.  ?Cardiovascular:  ?   Rate and Rhythm: Normal rate and regular rhythm.  ?   Heart sounds: Normal heart sounds.  ?Pulmonary:  ?   Effort: Pulmonary effort is normal.  ?   Breath sounds: Normal breath sounds. No wheezing, rhonchi or rales.  ?Abdominal:  ?   General: Bowel sounds are normal. There is no distension.  ?   Palpations: Abdomen is soft. There is no mass.  ?   Tenderness: There is no abdominal tenderness. There is no right CVA tenderness, left CVA tenderness, guarding or rebound.  ?Genitourinary: ?   Comments: Chaperone, Kim, Therapist, sports.  ?External genitalia are without lesion or rash. No obvious vaginal discharge. Some mucosal irritation with insertion of cervicovaginal swab. Did not perform a speculum exam. No abd pain. ?Skin: ?   General: Skin is warm.  ?   Capillary Refill: Capillary refill takes less than 2 seconds.  ?   Comments: Good skin turgor  ?Neurological:  ?   General: No focal deficit present.  ?   Mental Status: She is alert and oriented to person, place, and time.  ?Psychiatric:     ?   Mood and Affect: Mood normal.     ?   Behavior: Behavior normal.  ? ? ? ?UC Treatments / Results  ?Labs ?(all labs ordered are listed, but only abnormal results are displayed) ?Labs Reviewed  ?POC URINE PREG, ED  ?CERVICOVAGINAL ANCILLARY ONLY  ? ? ?EKG ? ? ?Radiology ?No results found. ? ?Procedures ?Procedures (including critical care time) ? ?Medications Ordered in UC ?Medications - No data to display ? ?Initial  Impression / Assessment and Plan / UC Course  ?I  have reviewed the triage vital signs and the nursing notes. ? ?Pertinent labs & imaging results that were available during my care of the patient were reviewed by me and considered in my medical decision making (see chart for details). ? ?  ? ?This patient is a very pleasant 21 y.o. year old female presenting with suspected genital herpes, though this has actually resolved at the time of this visit. She is afebrile and nontachycardic with no abdominal pain. These is some vaginal mucosal irritation without rash or lesion. Cervicovaginal swab sent. Negative pregnancy test. Return if vaginal lesions recur so we can send HSV swab.  ? ?Final Clinical Impressions(s) / UC Diagnoses  ? ?Final diagnoses:  ?Vaginitis and vulvovaginitis  ?Negative pregnancy test  ? ? ? ?Discharge Instructions   ? ?  ?-Albuterol inhaler as needed for cough, wheezing, shortness of breath, 1 to 2 puffs every 6 hours as needed. ?-We have sent testing for sexually transmitted infections. We will notify you of any positive results once they are received. If required, we will prescribe any medications you might need. Please refrain from all sexual activity until treatment is complete.  ?-Seek additional medical attention if you develop fevers/chills, new/worsening abdominal pain, new/worsening vaginal discomfort/discharge, etc.  ?-If you get the sores in your vaginal area again, come back and we'll swab for HSV (genital herpes) ? ? ?ED Prescriptions   ? ? Medication Sig Dispense Auth. Provider  ? albuterol (VENTOLIN HFA) 108 (90 Base) MCG/ACT inhaler Inhale 1-2 puffs into the lungs every 6 (six) hours as needed for wheezing or shortness of breath. 1 each Hazel Sams, PA-C  ? ?  ? ?PDMP not reviewed this encounter. ?  ?Hazel Sams, PA-C ?12/08/21 1347 ? ?

## 2021-12-08 NOTE — Discharge Instructions (Addendum)
-  Albuterol inhaler as needed for cough, wheezing, shortness of breath, 1 to 2 puffs every 6 hours as needed. ?-We have sent testing for sexually transmitted infections. We will notify you of any positive results once they are received. If required, we will prescribe any medications you might need. Please refrain from all sexual activity until treatment is complete.  ?-Seek additional medical attention if you develop fevers/chills, new/worsening abdominal pain, new/worsening vaginal discomfort/discharge, etc.  ?-If you get the sores in your vaginal area again, come back and we'll swab for HSV (genital herpes) ?

## 2021-12-08 NOTE — ED Triage Notes (Signed)
C/o vaginal bumps and itching x 2-3 weeks.  ?Pt request urine pregnancy test. She reports her period is late and she is concern. ?

## 2021-12-08 NOTE — ED Notes (Signed)
At bedside for provider examination.  Vernona Rieger, NP obtained cyto swab.  Verified labeling prior to transport  ?

## 2021-12-09 ENCOUNTER — Ambulatory Visit (HOSPITAL_COMMUNITY): Payer: Self-pay

## 2021-12-09 ENCOUNTER — Telehealth (HOSPITAL_COMMUNITY): Payer: Self-pay | Admitting: Emergency Medicine

## 2021-12-09 LAB — CERVICOVAGINAL ANCILLARY ONLY
Bacterial Vaginitis (gardnerella): NEGATIVE
Candida Glabrata: NEGATIVE
Candida Vaginitis: POSITIVE — AB
Chlamydia: NEGATIVE
Comment: NEGATIVE
Comment: NEGATIVE
Comment: NEGATIVE
Comment: NEGATIVE
Comment: NEGATIVE
Comment: NORMAL
Neisseria Gonorrhea: NEGATIVE
Trichomonas: NEGATIVE

## 2021-12-09 MED ORDER — FLUCONAZOLE 150 MG PO TABS: 150.0000 mg | ORAL_TABLET | Freq: Once | ORAL | 0 refills | Status: AC

## 2022-01-15 IMAGING — CT CT ABD-PELV W/ CM
2 of 4 series · 15 of 46 positions shown, 17 images · IV contrast (omnipaque)
Comparison: Pelvic ultrasound dated 03/14/2020.

CLINICAL DATA: 19-year-old female with abdominal pain.

EXAM:
CT ABDOMEN AND PELVIS WITH CONTRAST
TECHNIQUE: Multidetector CT imaging of the abdomen and pelvis was performed
using the standard protocol following bolus administration of
intravenous contrast.
CONTRAST:  100mL OMNIPAQUE IOHEXOL 300 MG/ML  SOLN

[Series 3: abdomen 5.0 · axial · 0.56mm/px · z∈[-554,-159]mm · 12 of 89 slices shown, 14 images]
[im 5/89  soft-tissue]
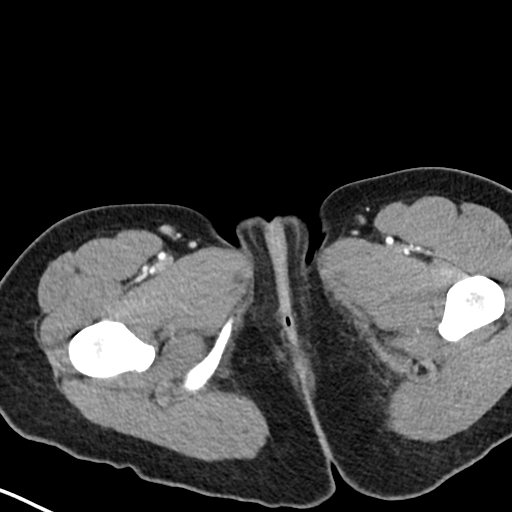
[im 5/89  bone]
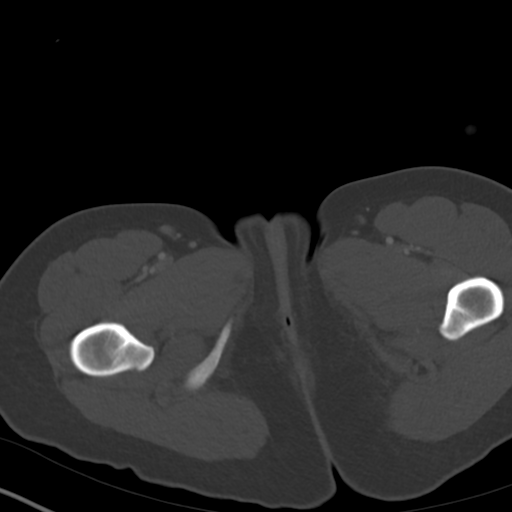
[im 14/89  soft-tissue]
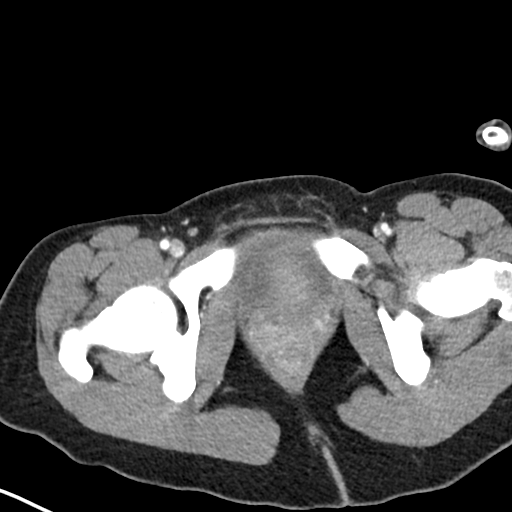
[im 19/89  soft-tissue]
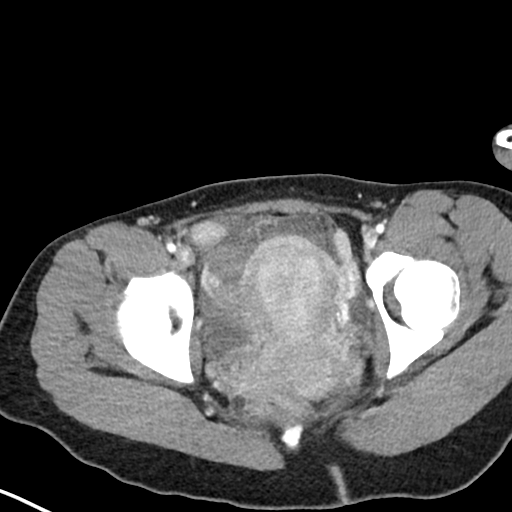
[im 28/89  soft-tissue]
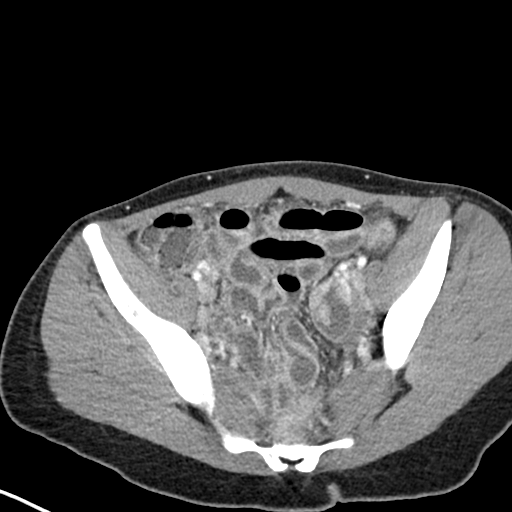
[im 33/89  soft-tissue]
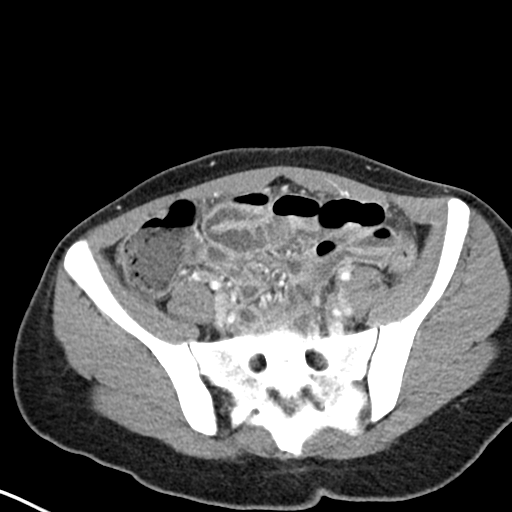
[im 42/89  soft-tissue]
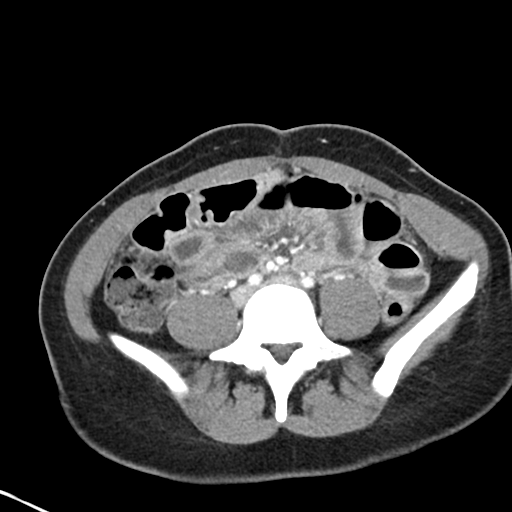
[im 47/89  soft-tissue]
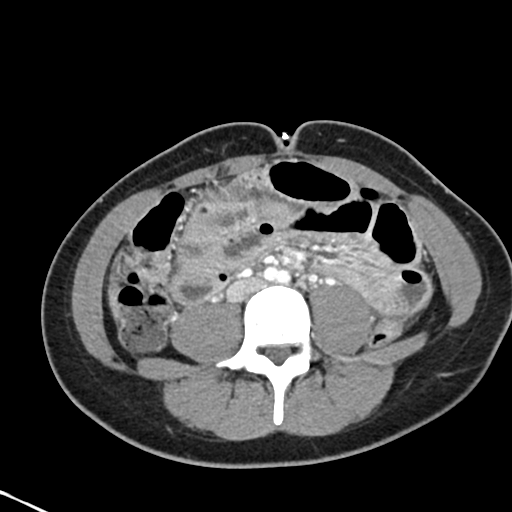
[im 56/89  soft-tissue]
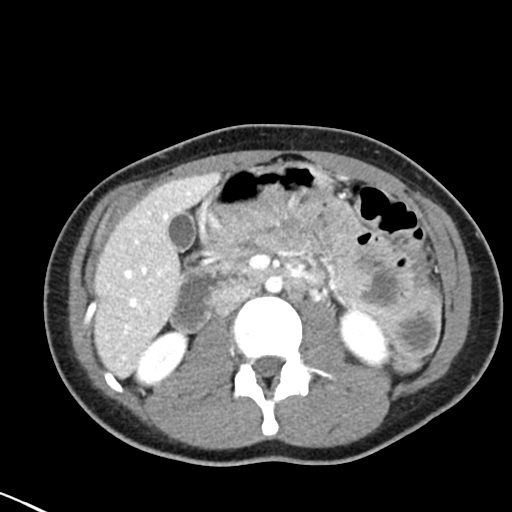
[im 61/89  soft-tissue]
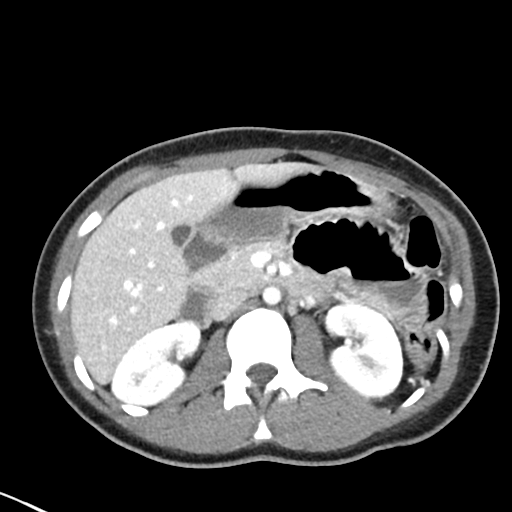
[im 61/89  bone]
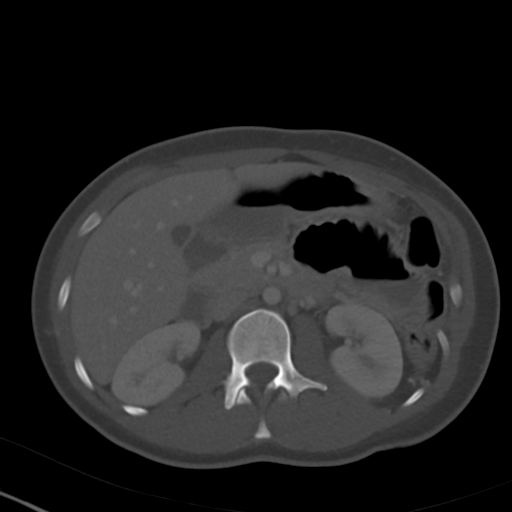
[im 70/89  soft-tissue]
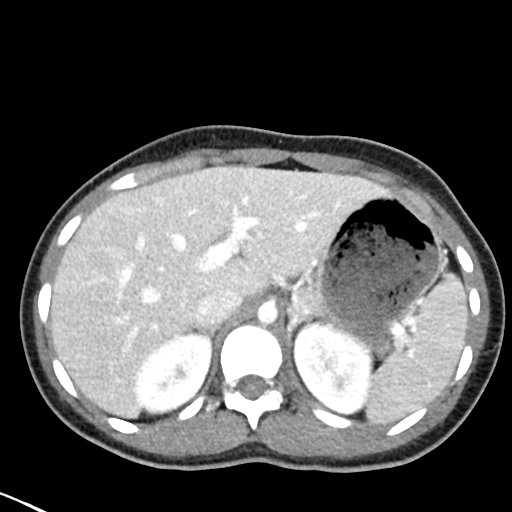
[im 75/89  soft-tissue]
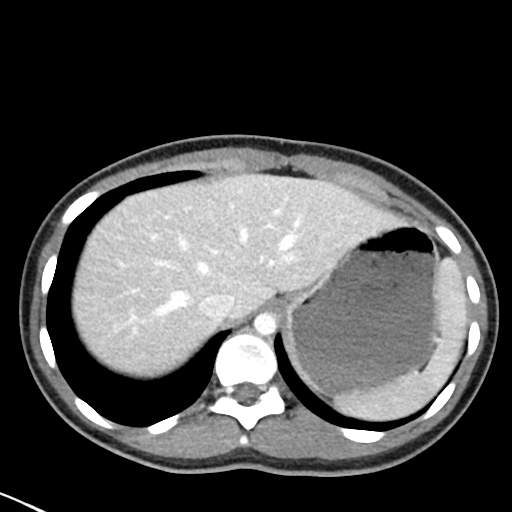
[im 84/89  soft-tissue]
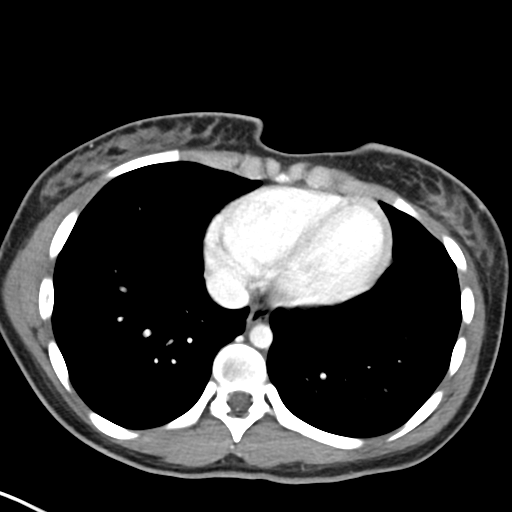

[Series 6: abdomen 3.0 mpr cor · coronal · 0.61mm/px · 3 of 75 slices shown]
[im 25/75  soft-tissue]
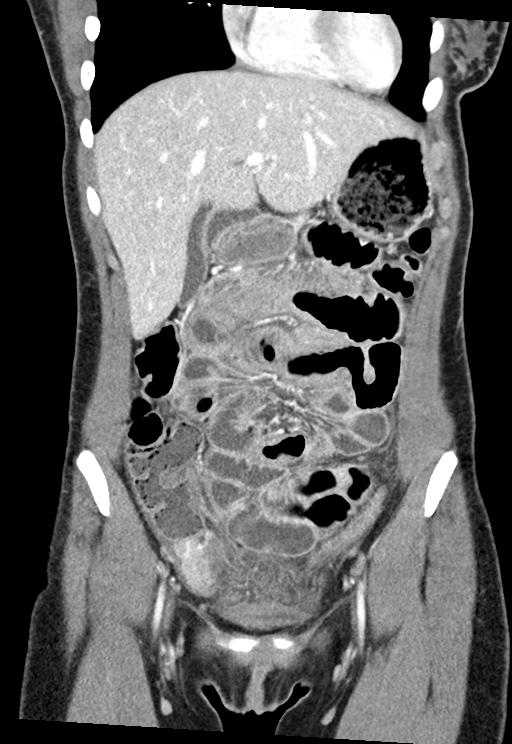
[im 33/75  soft-tissue]
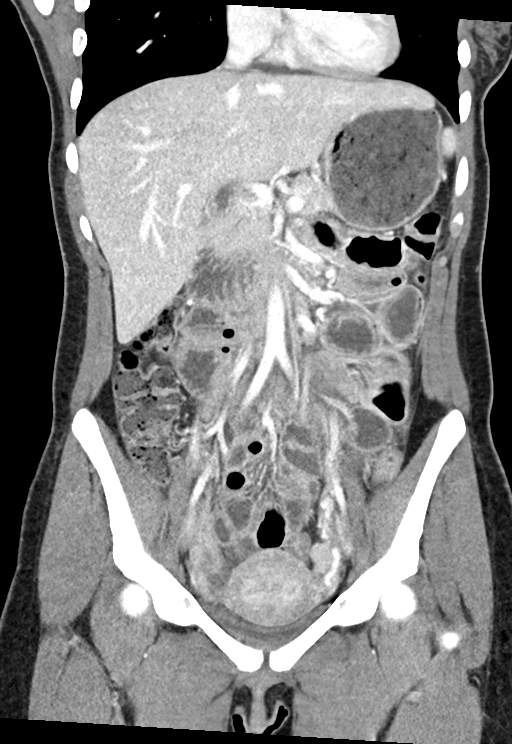
[im 42/75  soft-tissue]
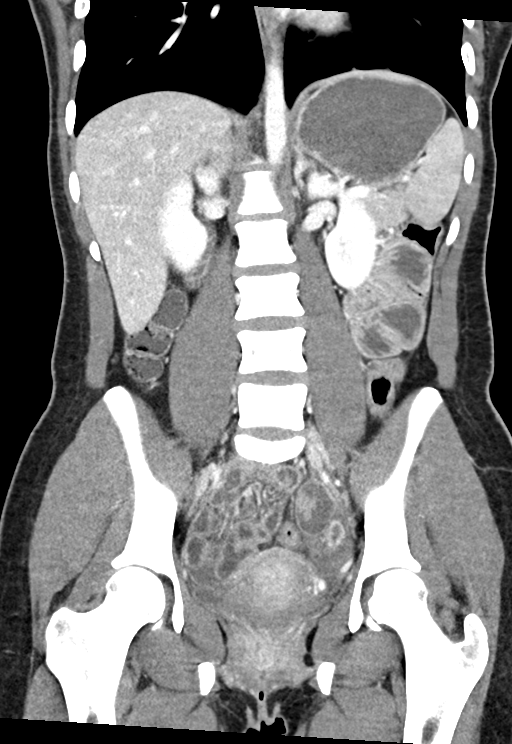

[15 of 46 positions shown; findings below may reference images not displayed]

FINDINGS: Lower chest: The visualized lung bases are clear.

No intra-abdominal free air or free fluid.

Hepatobiliary: No focal liver abnormality is seen. No gallstones,
gallbladder wall thickening, or biliary dilatation.

Pancreas: Unremarkable. No pancreatic ductal dilatation or
surrounding inflammatory changes.

Spleen: Normal in size without focal abnormality.

Adrenals/Urinary Tract: The adrenal glands are unremarkable. The
kidneys, and visualized ureters appear unremarkable. The urinary
bladder is collapsed.

Stomach/Bowel: There is diffuse mucosal thickening and enhancement
of the small bowel consistent with enteritis. Clinical correlation
is recommended. There is no bowel obstruction. The appendix is
normal.

Vascular/Lymphatic: The abdominal aorta and IVC are unremarkable. No
portal venous gas. There is no adenopathy.

Reproductive: The uterus is anteverted and grossly unremarkable.
There is a 3 cm left ovarian corpus luteum. There is an enhancing
tissue in the right hemipelvis anteriorly, likely vascular pedicle
of the right ovary.

Other: There is diffuse pelvic edema.

Musculoskeletal: No acute or significant osseous findings.
IMPRESSION: 1. Enteritis. Clinical correlation is recommended. No bowel
obstruction. Normal appendix.
2. A 3 cm left ovarian corpus luteum.

## 2022-02-21 ENCOUNTER — Other Ambulatory Visit: Payer: Self-pay

## 2022-02-21 ENCOUNTER — Encounter (HOSPITAL_COMMUNITY): Payer: Self-pay | Admitting: Emergency Medicine

## 2022-02-21 ENCOUNTER — Emergency Department (HOSPITAL_COMMUNITY)
Admission: EM | Admit: 2022-02-21 | Discharge: 2022-02-22 | Disposition: A | Discharge status: Home / Self Care | Payer: Medicaid Other | Attending: Emergency Medicine | Admitting: Emergency Medicine

## 2022-02-21 DIAGNOSIS — R1030 Lower abdominal pain, unspecified: Secondary | ICD-10-CM | POA: Diagnosis present

## 2022-02-21 DIAGNOSIS — R109 Unspecified abdominal pain: Secondary | ICD-10-CM

## 2022-02-21 DIAGNOSIS — N941 Unspecified dyspareunia: Secondary | ICD-10-CM

## 2022-02-21 NOTE — ED Triage Notes (Signed)
Patient reports hypogastric pain this evening and rectal pain /constipation this week . No emesis / no diarrhea . Refused blood tests at triage .

## 2022-02-22 LAB — LIPASE, BLOOD: Lipase: 28 U/L (ref 11–51)

## 2022-02-22 LAB — COMPREHENSIVE METABOLIC PANEL
ALT: 10 U/L (ref 0–44)
AST: 17 U/L (ref 15–41)
Albumin: 4 g/dL (ref 3.5–5.0)
Alkaline Phosphatase: 53 U/L (ref 38–126)
Anion gap: 6 (ref 5–15)
BUN: 13 mg/dL (ref 6–20)
CO2: 25 mmol/L (ref 22–32)
Calcium: 9.5 mg/dL (ref 8.9–10.3)
Chloride: 106 mmol/L (ref 98–111)
Creatinine, Ser: 0.77 mg/dL (ref 0.44–1.00)
GFR, Estimated: >60 mL/min (ref >60)
Glucose, Bld: 85 mg/dL (ref 70–99)
Potassium: 3.8 mmol/L (ref 3.5–5.1)
Sodium: 137 mmol/L (ref 135–145)
Total Bilirubin: 0.6 mg/dL (ref 0.3–1.2)
Total Protein: 7.2 g/dL (ref 6.5–8.1)

## 2022-02-22 LAB — URINALYSIS, ROUTINE W REFLEX MICROSCOPIC
Bilirubin Urine: NEGATIVE
Glucose, UA: NEGATIVE mg/dL
Hgb urine dipstick: NEGATIVE
Ketones, ur: NEGATIVE mg/dL
Leukocytes,Ua: NEGATIVE
Nitrite: NEGATIVE
Protein, ur: NEGATIVE mg/dL
Specific Gravity, Urine: 1.015 (ref 1.005–1.030)
pH: 6 (ref 5.0–8.0)

## 2022-02-22 LAB — CBC
HCT: 39.9 % (ref 36.0–46.0)
Hemoglobin: 13.3 g/dL (ref 12.0–15.0)
MCH: 29.2 pg (ref 26.0–34.0)
MCHC: 33.3 g/dL (ref 30.0–36.0)
MCV: 87.5 fL (ref 80.0–100.0)
Platelets: 298 10*3/uL (ref 150–400)
RBC: 4.56 MIL/uL (ref 3.87–5.11)
RDW: 13.2 % (ref 11.5–15.5)
WBC: 9.3 10*3/uL (ref 4.0–10.5)
nRBC: 0 % (ref 0.0–0.2)

## 2022-02-22 LAB — I-STAT BETA HCG BLOOD, ED (MC, WL, AP ONLY): I-stat hCG, quantitative: <5 m[IU]/mL (ref <5)

## 2022-02-22 MED ORDER — KETOROLAC TROMETHAMINE 15 MG/ML IJ SOLN
15.0000 mg | Freq: Once | INTRAMUSCULAR | Status: AC
Start: 2022-02-22 — End: 2022-02-22
  Administered 2022-02-22: 15 mg via INTRAVENOUS
  Filled 2022-02-22: qty 1

## 2022-02-22 NOTE — ED Provider Notes (Signed)
[facility] EMERGENCY DEPARTMENT Provider Note  CSN: [id number] Arrival date & time: 02/21/22 2059  Chief Complaint(s) Abdominal Pain (Constipation / Rectal Pain /)  HPI Whitney Hickman is a 21 y.o. female with a past medical history listed below including ectopic pregnancy status post right salpingectomy who presents to the emergency department with lower abdominal pain noted with sexual intercourse.  This been ongoing since her surgery but was severe tonight.  She reports feeling pain in her rectum as well.  States that she feels like she might be constipated but had a bowel movement 2 days ago.  Reports that she normally has bowel movements every couple or few days.  She denies any vaginal bleeding or discharge.  Last menstrual cycle was early in the month.  She reports that this pain was similar to the pain she had with her ectopic pregnancy.  The history is provided by the patient.    Past Medical History Past Medical History:  Diagnosis Date   PID (acute pelvic inflammatory disease)    Patient Active Problem List   Diagnosis Date Noted   History of ectopic pregnancy 07/15/2021   Home Medication(s) Prior to Admission medications   Medication Sig Start Date End Date Taking? Authorizing Provider  albuterol (VENTOLIN HFA) 108 (90 Base) MCG/ACT inhaler Inhale 1-2 puffs into the lungs every 6 (six) hours as needed for wheezing or shortness of breath. 12/08/21   Rhys Martini, PA-C  gabapentin (NEURONTIN) 300 MG capsule Take 1 capsule (300 mg total) by mouth 2 (two) times daily as needed (nerve pain at the incision). 07/14/21   Mer[place] Bing, MD  ibuprofen (ADVIL) 600 MG tablet Take 1 tablet (600 mg total) by mouth every 6 (six) hours as needed. 06/14/21   Adam Phenix, MD                                                                                                                                    Allergies Patient has no known allergies.  Review of  Systems Review of Systems As noted in HPI  Physical Exam Vital Signs  I have reviewed the triage vital signs BP 104/70 (BP Location: Right Arm)   Pulse 75   Temp 98.5 F (36.9 C) (Oral)   Resp 16   SpO2 100%   Physical Exam Vitals reviewed.  Constitutional:      General: She is not in acute distress.    Appearance: She is well-developed. She is not diaphoretic.  HENT:     Head: Normocephalic and atraumatic.     Right Ear: External ear normal.     Left Ear: External ear normal.     Nose: Nose normal.  Eyes:     General: No scleral icterus.    Conjunctiva/sclera: Conjunctivae normal.  Neck:     Trachea: Phonation normal.  Cardiovascular:     Rate and Rhythm: Normal rate and regular rhythm.  Pulmonary:     Effort: Pulmonary effort is normal. No respiratory distress.     Breath sounds: No stridor.  Abdominal:     General: There is no distension.     Tenderness: There is generalized abdominal tenderness (mild). There is no guarding or rebound. Negative signs include Murphy's sign and McBurney's sign.  Musculoskeletal:        General: Normal range of motion.     Cervical back: Normal range of motion.  Neurological:     Mental Status: She is alert and oriented to person, place, and time.  Psychiatric:        Behavior: Behavior normal.     ED Results and Treatments Labs (all labs ordered are listed, but only abnormal results are displayed) Labs Reviewed  LIPASE, BLOOD  COMPREHENSIVE METABOLIC PANEL  CBC  URINALYSIS, ROUTINE W REFLEX MICROSCOPIC  I-STAT BETA HCG BLOOD, ED ([facility], [facility], [facility] ONLY)                                                                                                                         EKG  EKG Interpretation  Date/Time:    Ventricular Rate:    PR Interval:    QRS Duration:   QT Interval:    QTC Calculation:   R Axis:     Text Interpretation:         Radiology No results found.  Pertinent labs & imaging results that were  available during my care of the patient were reviewed by me and considered in my medical decision making (see MDM for details).  Medications Ordered in ED Medications  ketorolac (TORADOL) 15 MG/ML injection 15 mg (15 mg Intravenous Given 02/22/22 0345)                                                                                                                                     Procedures Procedures  (including critical care time)  Medical Decision Making / ED Course    Complexity of Problem:  Co-morbidities/SDOH that complicate the patient evaluation/care: Noted above in HPI  Patient's presenting problem/concern, DDX, and MDM listed below: Abdominal pain During coitus Abdomen with generalized discomfort without evidence of peritonitis We will obtain screening labs to assess for any evidence of serious intra-abdominal inflammatory/infectious process though I have low suspicion for this.. We will rule out urinary tract infection. We will rule out pregnancy related  process.    Complexity of Data:   Laboratory Tests ordered listed below with my independent interpretation: CBC without leukocytosis or anemia Metabolic panel without significant electrolyte derangement or renal sufficiency.  No evidence of bili obstruction or pancreatitis. Beta-hCG negative UA completely clean and negative for evidence of infection.     ED Course:    Assessment, Add'l Intervention, and Reassessment: Abd cramping With intercourse Work up reassuring    Final Clinical Impression(s) / ED Diagnoses Final diagnoses:  Pain in female genitalia on intercourse  Abdominal cramping   The patient appears reasonably screened and/or stabilized for discharge and I doubt any other medical condition or other [facility] requiring further screening, evaluation, or treatment in the ED at this time. I have discussed the findings, Dx and Tx plan with the patient/family who expressed understanding and agree(s) with  the plan. Discharge instructions discussed at length. The patient/family was given strict return precautions who verbalized understanding of the instructions. No further questions at time of discharge.  Disposition: Discharge  Condition: Good  ED Discharge Orders     None      Follow Up: Primary care provider  Call  to schedule an appointment for close follow up           This chart was dictated using voice recognition software.  Despite best efforts to proofread,  errors can occur which can change the documentation meaning.    Nira Conn, MD 02/22/22 [phone]

## 2022-03-20 ENCOUNTER — Ambulatory Visit (HOSPITAL_COMMUNITY)
Admission: RE | Admit: 2022-03-20 | Discharge: 2022-03-20 | Disposition: A | Discharge status: Home / Self Care | Payer: Medicaid Other | Source: Ambulatory Visit | Attending: Emergency Medicine | Admitting: Emergency Medicine

## 2022-03-20 ENCOUNTER — Encounter (HOSPITAL_COMMUNITY): Payer: Self-pay

## 2022-03-20 VITALS — BP 101/78 | HR 63 | Temp 98.9°F | Resp 15

## 2022-03-20 DIAGNOSIS — N760 Acute vaginitis: Secondary | ICD-10-CM | POA: Diagnosis present

## 2022-03-20 DIAGNOSIS — B9689 Other specified bacterial agents as the cause of diseases classified elsewhere: Secondary | ICD-10-CM | POA: Diagnosis present

## 2022-03-20 DIAGNOSIS — M79672 Pain in left foot: Secondary | ICD-10-CM

## 2022-03-20 DIAGNOSIS — N898 Other specified noninflammatory disorders of vagina: Secondary | ICD-10-CM | POA: Diagnosis present

## 2022-03-20 DIAGNOSIS — G8929 Other chronic pain: Secondary | ICD-10-CM | POA: Insufficient documentation

## 2022-03-20 LAB — POC URINE PREG, ED: Preg Test, Ur: NEGATIVE

## 2022-03-20 MED ORDER — METRONIDAZOLE 500 MG PO TABS: 500.0000 mg | ORAL_TABLET | Freq: Two times a day (BID) | ORAL | 0 refills | Status: AC

## 2022-03-20 NOTE — ED Triage Notes (Signed)
Pt reports that having vaginal discharge and odor that came after her menstrual cycle the beginning of the month.   Pt having pain in left foot that has been ongoing with weight bearing and/or flexing of foot.   Pt reports that had an anxiety attack on Wed and EMS came out. Reports that EMS instructed patient to ask doctor about getting on medication to help with anxiety

## 2022-03-20 NOTE — ED Provider Notes (Signed)
[facility]    CSN: [id number] Arrival date & time: 03/20/22  1717     History   Chief Complaint Chief Complaint  Patient presents with   appt 530   Vaginal Discharge   Foot Pain    HPI Whitney Hickman is a 21 y.o. female.  Presents with vaginal discharge. Reports odor with discharge that began after her menstrual cycle 8/3. Additionally requesting pregnancy test.  Reports left foot pain that is worse with standing. Injury occurred 1 year ago, she was put in a cast. Has not tried medication for symptoms. Has used ice.  She is requesting anxiety medicine today.  She does not have a primary care provider  Past Medical History:  Diagnosis Date   PID (acute pelvic inflammatory disease)     Patient Active Problem List   Diagnosis Date Noted   History of ectopic pregnancy 07/15/2021    Past Surgical History:  Procedure Laterality Date   DIAGNOSTIC LAPAROSCOPY WITH REMOVAL OF ECTOPIC PREGNANCY Right 06/12/2021   Procedure: EXPLORATORY  LAPAROSCOPY WITH REMOVAL OF ECTOPIC PREGNANCY;  Surgeon: [place] Bing, MD;  Location: [facility] OR;  Service: Gynecology;  Laterality: Right;   NO PAST SURGERIES      OB History     Gravida  2   Para  1   Term  1   Preterm  0   AB  1   Living  1      SAB  0   IAB  0   Ectopic  1   Multiple      Live Births  1            Home Medications    Prior to Admission medications   Medication Sig Start Date End Date Taking? Authorizing Provider  metroNIDAZOLE (FLAGYL) 500 MG tablet Take 1 tablet (500 mg total) by mouth 2 (two) times daily for 7 days. 03/20/22 03/27/22 Yes Jeronda Don, Lurena Joiner, PA-C  albuterol (VENTOLIN HFA) 108 (90 Base) MCG/ACT inhaler Inhale 1-2 puffs into the lungs every 6 (six) hours as needed for wheezing or shortness of breath. 12/08/21   Rhys Martini, PA-C  ibuprofen (ADVIL) 600 MG tablet Take 1 tablet (600 mg total) by mouth every 6 (six) hours as needed. 06/14/21   Adam Phenix, MD     Family History No family history on file.  Social History Social History   Tobacco Use   Smoking status: Some Days    Types: Cigars   Smokeless tobacco: Never  Vaping Use   Vaping Use: Never used  Substance Use Topics   Alcohol use: Not Currently   Drug use: Not Currently    Types: Marijuana     Allergies   Patient has no known allergies.   Review of Systems Review of Systems  Genitourinary:  Positive for vaginal discharge.   Per HPI  Physical Exam Triage Vital Signs ED Triage Vitals  Enc Vitals Group     BP 03/20/22 1738 101/78     Pulse Rate 03/20/22 1738 63     Resp 03/20/22 1738 15     Temp 03/20/22 1738 98.9 F (37.2 C)     Temp Source 03/20/22 1738 Oral     SpO2 03/20/22 1738 98 %     Weight --      Height --      Head Circumference --      Peak Flow --      Pain Score 03/20/22 1737 8  Pain Loc --      Pain Edu? --      Excl. in GC? --    No data found.  Updated Vital Signs BP 101/78 (BP Location: Left Arm)   Pulse 63   Temp 98.9 F (37.2 C) (Oral)   Resp 15   LMP 02/26/2022   SpO2 98%    Physical Exam Vitals and nursing note reviewed.  Constitutional:      Appearance: Normal appearance.  HENT:     Mouth/Throat:     Pharynx: Oropharynx is clear.  Eyes:     Conjunctiva/sclera: Conjunctivae normal.  Cardiovascular:     Rate and Rhythm: Normal rate and regular rhythm.     Heart sounds: Normal heart sounds.  Pulmonary:     Effort: Pulmonary effort is normal.     Breath sounds: Normal breath sounds.  Abdominal:     General: Bowel sounds are normal.     Tenderness: There is no abdominal tenderness. There is no guarding.  Musculoskeletal:     Comments: Gait normal. No deformity of foot noted. No bony tenderness.   Neurological:     Mental Status: She is alert and oriented to person, place, and time.     UC Treatments / Results  Labs (all labs ordered are listed, but only abnormal results are displayed) Labs Reviewed   POC URINE PREG, ED  CERVICOVAGINAL ANCILLARY ONLY    EKG  Radiology No results found.  Procedures Procedures  Medications Ordered in UC Medications - No data to display  Initial Impression / Assessment and Plan / UC Course  I have reviewed the triage vital signs and the nursing notes.  Pertinent labs & imaging results that were available during my care of the patient were reviewed by me and considered in my medical decision making (see chart for details).  Pregnancy test negative. Cytology swab pending at this time. Patient has history of BV and believes this is the cause of the odor, will treat with Flagyl twice daily for 7 days.  Chronic foot pain.  No indication for x-ray imaging at this time.  Recommend ibuprofen 800 every 6 hours with ice as needed.  Follow-up with orthopedic specialist.  Gave patient information for the behavioral health urgent care.  She can follow-up with them regarding her anxiety and medication.  Additionally added to PCP assistance list  Return precautions discussed. Patient agrees to plan  Final Clinical Impressions(s) / UC Diagnoses   Final diagnoses:  Vaginal discharge  BV (bacterial vaginosis)  Chronic foot pain, left     Discharge Instructions      For the vaginal discharge, take the flagyl twice daily for 7 days. We will call you if something returns positive on your swab. Please do not drink alcohol while taking this medication as it can make you very sick.  Try Advil 800 mg every 6 hours as needed for foot pain. You can apply ice to the area to help as well. I recommend follow up with the orthopedic specialists regarding your persistent pain.  Follow up with the [facility] regarding anxiety and medication for it.     ED Prescriptions     Medication Sig Dispense Auth. Provider   metroNIDAZOLE (FLAGYL) 500 MG tablet Take 1 tablet (500 mg total) by mouth 2 (two) times daily for 7 days. 14 tablet Tian Davison, Lurena Joiner,  PA-C      PDMP not reviewed this encounter.   Valeta Paz, Ray Church 03/20/22 1828

## 2022-03-20 NOTE — Discharge Instructions (Addendum)
For the vaginal discharge, take the flagyl twice daily for 7 days. We will call you if something returns positive on your swab. Please do not drink alcohol while taking this medication as it can make you very sick.  Try Advil 800 mg every 6 hours as needed for foot pain. You can apply ice to the area to help as well. I recommend follow up with the orthopedic specialists regarding your persistent pain.  Follow up with the [facility] center regarding anxiety and medication for it.

## 2022-03-23 LAB — CERVICOVAGINAL ANCILLARY ONLY
Bacterial Vaginitis (gardnerella): POSITIVE — AB
Candida Glabrata: NEGATIVE
Candida Vaginitis: NEGATIVE
Chlamydia: NEGATIVE
Comment: NEGATIVE
Comment: NEGATIVE
Comment: NEGATIVE
Comment: NEGATIVE
Comment: NEGATIVE
Comment: NORMAL
Neisseria Gonorrhea: NEGATIVE
Trichomonas: NEGATIVE

## 2022-03-31 ENCOUNTER — Encounter (HOSPITAL_COMMUNITY): Payer: Self-pay | Admitting: Emergency Medicine

## 2022-03-31 ENCOUNTER — Ambulatory Visit (HOSPITAL_COMMUNITY)
Admission: RE | Admit: 2022-03-31 | Discharge: 2022-03-31 | Discharge status: Absent without leave | Payer: Self-pay | Source: Ambulatory Visit

## 2022-03-31 ENCOUNTER — Ambulatory Visit (HOSPITAL_COMMUNITY)
Admission: EM | Admit: 2022-03-31 | Discharge: 2022-03-31 | Disposition: A | Discharge status: Home / Self Care | Payer: Medicaid Other | Attending: Family Medicine | Admitting: Family Medicine

## 2022-03-31 ENCOUNTER — Ambulatory Visit (HOSPITAL_COMMUNITY): Payer: Self-pay

## 2022-03-31 DIAGNOSIS — L918 Other hypertrophic disorders of the skin: Secondary | ICD-10-CM

## 2022-03-31 MED ORDER — SILVER NITRATE-POT NITRATE 75-25 % EX MISC
CUTANEOUS | Status: AC
  Filled 2022-03-31: qty 10

## 2022-03-31 MED ORDER — LIDOCAINE-EPINEPHRINE 1 %-1:100000 IJ SOLN
INTRAMUSCULAR | Status: AC
  Filled 2022-03-31: qty 1

## 2022-03-31 NOTE — Discharge Instructions (Signed)
If not allergic, you may use over the counter ibuprofen or acetaminophen as needed. ° °

## 2022-03-31 NOTE — ED Triage Notes (Signed)
Pt reports an abscess between buttocks for awhile. States it has gotten bigger within the last week.  Also reports an intermittent rash under breast.

## 2022-04-01 NOTE — ED Provider Notes (Signed)
  [facility]   [id number] 03/31/22 Arrival Time: 1800  ASSESSMENT & PLAN:  1. Inflamed skin tag    Skin Tag Removal Procedure Note:  Anesthesia: 1% lidocaine with epinephrine  Procedure Details  The procedure, risks and complications have been discussed in detail (including, but not limited to pain and bleeding) with the patient.  The skin induration was prepped and draped in the usual fashion. After adequate local anesthesia, skin tag or right lower buttock shaved with a #11 blade. Declines pathology.  EBL: minimal Drains: none Packing: n/a Condition: Tolerated procedure well Complications: none. Chaperone present during exam and procedure.  Wound care inst given.  OTC analgesics as needed.  Reviewed expectations re: course of current medical issues. Questions answered. Outlined signs and symptoms indicating need for more acute intervention. Patient verbalized understanding. After Visit Summary given.   SUBJECTIVE:  Whitney Hickman is a 21 y.o. female who presents with a "painful bump on my butt"; noted sev days ago; maybe present longer; she is not sure. No bleeding. Fever: absent. OTC/home treatment: none.   OBJECTIVE:  Vitals:   03/31/22 1813  BP: 113/70  Pulse: (!) 53  Resp: 16  Temp: 98.1 F (36.7 C)  TempSrc: Oral  SpO2: 97%     General appearance: alert; no distress R buttock: small inflamed skin tag present measuring approx 4 mm; no active drainage or bleeding Psychological: alert and cooperative; normal mood and affect  No Known Allergies  Past Medical History:  Diagnosis Date   PID (acute pelvic inflammatory disease)    Social History   Socioeconomic History   Marital status: Single    Spouse name: Not on file   Number of children: 1   Years of education: 12   Highest education level: High school graduate  Occupational History   Not on file  Tobacco Use   Smoking status: Some Days    Types: Cigars   Smokeless tobacco:  Never  Vaping Use   Vaping Use: Never used  Substance and Sexual Activity   Alcohol use: Not Currently   Drug use: Not Currently    Types: Marijuana   Sexual activity: Not Currently  Other Topics Concern   Not on file  Social History Narrative   ** Merged History Encounter **       Social Determinants of Health   Financial Resource Strain: Not on file  Food Insecurity: No Food Insecurity (07/14/2021)   Hunger Vital Sign    Worried About Running Out of Food in the Last Year: Never true    Ran Out of Food in the Last Year: Never true  Transportation Needs: No Transportation Needs (07/14/2021)   PRAPARE - Administrator, Civil Service (Medical): No    Lack of Transportation (Non-Medical): No  Physical Activity: Not on file  Stress: Not on file  Social Connections: Not on file   History reviewed. No pertinent family history. Past Surgical History:  Procedure Laterality Date   DIAGNOSTIC LAPAROSCOPY WITH REMOVAL OF ECTOPIC PREGNANCY Right 06/12/2021   Procedure: EXPLORATORY  LAPAROSCOPY WITH REMOVAL OF ECTOPIC PREGNANCY;  Surgeon: [place] Bing, MD;  Location: [facility] OR;  Service: Gynecology;  Laterality: Right;   NO PAST SURGERIES              Mardella Layman, MD 04/01/22 [phone]

## 2022-05-06 ENCOUNTER — Encounter (HOSPITAL_COMMUNITY): Payer: Self-pay

## 2022-05-06 ENCOUNTER — Ambulatory Visit (HOSPITAL_COMMUNITY)
Admission: RE | Admit: 2022-05-06 | Discharge: 2022-05-06 | Disposition: A | Discharge status: Home / Self Care | Payer: Medicaid Other | Source: Ambulatory Visit | Attending: Physician Assistant | Admitting: Physician Assistant

## 2022-05-06 VITALS — BP 127/81 | HR 76 | Temp 98.3°F | Resp 12

## 2022-05-06 DIAGNOSIS — N898 Other specified noninflammatory disorders of vagina: Secondary | ICD-10-CM | POA: Insufficient documentation

## 2022-05-06 DIAGNOSIS — N730 Acute parametritis and pelvic cellulitis: Secondary | ICD-10-CM

## 2022-05-06 LAB — POC URINE PREG, ED: Preg Test, Ur: NEGATIVE

## 2022-05-06 MED ORDER — METRONIDAZOLE 500 MG PO TABS: 500.0000 mg | ORAL_TABLET | Freq: Two times a day (BID) | ORAL | 0 refills | Status: DC

## 2022-05-06 MED ORDER — DOXYCYCLINE HYCLATE 100 MG PO CAPS: 100.0000 mg | ORAL_CAPSULE | Freq: Two times a day (BID) | ORAL | 0 refills | Status: DC

## 2022-05-06 MED ORDER — CEFTRIAXONE SODIUM 500 MG IJ SOLR
500.0000 mg | Freq: Once | INTRAMUSCULAR | Status: AC
  Administered 2022-05-06: 500 mg via INTRAMUSCULAR

## 2022-05-06 MED ORDER — CEFTRIAXONE SODIUM 500 MG IJ SOLR
INTRAMUSCULAR | Status: AC
  Filled 2022-05-06: qty 500

## 2022-05-06 NOTE — Discharge Instructions (Signed)
We are treating you for pelvic inflammatory disease.  Take antibiotics as prescribed.  Stay out of the sun while on doxycycline.  Do not drink any alcohol while on metronidazole and for 3 days after completing course as it will cause you to vomit.  We will contact you if we need to arrange additional treatment based on your swab results.  You should abstain from sex for 7 days after completing treatment (minimum 3 weeks).  Make sure you are resting and drinking plenty of fluid.  Follow-up with OB/GYN; call to schedule appointment as soon as possible.  If anything worsens and you develop fever, nausea, vomiting, pelvic pain, weakness you need to go to the emergency room immediately.

## 2022-05-06 NOTE — ED Triage Notes (Signed)
Pt is here for vaginal dryness and possible bv , tingling sensation when having sex

## 2022-05-06 NOTE — ED Provider Notes (Signed)
[facility]    CSN: [id number] Arrival date & time: 05/06/22  1824      History   Chief Complaint No chief complaint on file.   HPI Whitney Hickman is a 21 y.o. female.   Patient presents today with a several day history of vaginal irritation and dryness.  Reports she has noticed a lack of lubrication with intercourse.  She denies any significant discharge, pelvic pain, abdominal pain, fever, nausea, vomiting.  She does occasionally have suprapubic pain related to scar but states this is unchanged from baseline.  She has not tried any over-the-counter medication for symptom management.  Denies any recent antibiotics.  She does have a history of bacterial vaginosis as well as PID but states current symptoms are not similar to previous episodes of this condition.  She denies any changes to personal hygiene products including soaps or detergents.  She denies any changes to personal lubricants.    Past Medical History:  Diagnosis Date   PID (acute pelvic inflammatory disease)     Patient Active Problem List   Diagnosis Date Noted   History of ectopic pregnancy 07/15/2021    Past Surgical History:  Procedure Laterality Date   DIAGNOSTIC LAPAROSCOPY WITH REMOVAL OF ECTOPIC PREGNANCY Right 06/12/2021   Procedure: EXPLORATORY  LAPAROSCOPY WITH REMOVAL OF ECTOPIC PREGNANCY;  Surgeon: [place] Bing, MD;  Location: [facility];  Service: Gynecology;  Laterality: Right;   NO PAST SURGERIES      OB History     Gravida  2   Para  1   Term  1   Preterm  0   AB  1   Living  1      SAB  0   IAB  0   Ectopic  1   Multiple      Live Births  1            Home Medications    Prior to Admission medications   Medication Sig Start Date End Date Taking? Authorizing Provider  albuterol (VENTOLIN HFA) 108 (90 Base) MCG/ACT inhaler Inhale 1-2 puffs into the lungs every 6 (six) hours as needed for wheezing or shortness of breath. 12/08/21   Rhys Martini, PA-C   doxycycline (VIBRAMYCIN) 100 MG capsule Take 1 capsule (100 mg total) by mouth 2 (two) times daily. 05/06/22   Sanjuanita Condrey, Noberto Retort, PA-C  ibuprofen (ADVIL) 600 MG tablet Take 1 tablet (600 mg total) by mouth every 6 (six) hours as needed. 06/14/21   Adam Phenix, MD  metroNIDAZOLE (FLAGYL) 500 MG tablet Take 1 tablet (500 mg total) by mouth 2 (two) times daily. 05/06/22   Rashiya Lofland, Noberto Retort, PA-C    Family History History reviewed. No pertinent family history.  Social History Social History   Tobacco Use   Smoking status: Some Days    Types: Cigars   Smokeless tobacco: Never  Vaping Use   Vaping Use: Never used  Substance Use Topics   Alcohol use: Not Currently   Drug use: Not Currently    Types: Marijuana     Allergies   Patient has no known allergies.   Review of Systems Review of Systems  Constitutional:  Negative for activity change, appetite change, fatigue and fever.  Gastrointestinal:  Positive for abdominal pain (chronic). Negative for diarrhea, nausea and vomiting.  Genitourinary:  Negative for dysuria, flank pain, frequency, pelvic pain, urgency, vaginal bleeding, vaginal discharge and vaginal pain.     Physical Exam Triage Vital Signs ED  Triage Vitals  Enc Vitals Group     BP 05/06/22 1835 127/81     Pulse Rate 05/06/22 1835 76     Resp 05/06/22 1835 12     Temp 05/06/22 1835 98.3 F (36.8 C)     Temp Source 05/06/22 1835 Oral     SpO2 05/06/22 1835 98 %     Weight --      Height --      Head Circumference --      Peak Flow --      Pain Score 05/06/22 1840 0     Pain Loc --      Pain Edu? --      Excl. in [place]? --    No data found.  Updated Vital Signs BP 127/81 (BP Location: Left Arm)   Pulse 76   Temp 98.3 F (36.8 C) (Oral)   Resp 12   LMP 05/02/2022   SpO2 98%   Visual Acuity Right Eye Distance:   Left Eye Distance:   Bilateral Distance:    Right Eye Near:   Left Eye Near:    Bilateral Near:     Physical Exam Vitals reviewed.   Constitutional:      General: She is awake. She is not in acute distress.    Appearance: Normal appearance. She is well-developed. She is not ill-appearing.     Comments: Very pleasant female appears stated age no acute distress sitting comfortably in exam room  HENT:     Head: Normocephalic and atraumatic.  Cardiovascular:     Rate and Rhythm: Normal rate and regular rhythm.     Heart sounds: Normal heart sounds, S1 normal and S2 normal. No murmur heard. Pulmonary:     Effort: Pulmonary effort is normal.     Breath sounds: Normal breath sounds. No wheezing, rhonchi or rales.     Comments: Clear to auscultation bilaterally Abdominal:     General: A surgical scar is present. Bowel sounds are normal.     Palpations: Abdomen is soft.     Tenderness: There is abdominal tenderness in the suprapubic area. There is no right CVA tenderness, left CVA tenderness, guarding or rebound.     Comments: Mild tenderness palpation suprapubic region with evidence of acute abdomen.  Genitourinary:    Labia:        Right: No rash or tenderness.        Left: No rash or tenderness.      Vagina: Vaginal discharge present.     Cervix: Cervical motion tenderness present.     Adnexa:        Right: Tenderness present. No mass.         Left: Tenderness present. No mass.       Comments: Thin white discharge noted posterior vaginal vault. Psychiatric:        Behavior: Behavior is cooperative.      UC Treatments / Results  Labs (all labs ordered are listed, but only abnormal results are displayed) Labs Reviewed  POCT URINALYSIS DIPSTICK, ED / UC  POC URINE PREG, ED  CERVICOVAGINAL ANCILLARY ONLY    EKG   Radiology No results found.  Procedures Procedures (including critical care time)  Medications Ordered in UC Medications  cefTRIAXone (ROCEPHIN) injection 500 mg (500 mg Intramuscular Given 05/06/22 1920)    Initial Impression / Assessment and Plan / UC Course  I have reviewed the triage  vital signs and the nursing notes.  Pertinent labs & imaging results  that were available during my care of the patient were reviewed by me and considered in my medical decision making (see chart for details).     Symptoms and discharge more consistent with yeast but patient did have significant CMT and adnexal tenderness on exam.  Will cover for PID given this tenderness and she was given Rocephin in clinic.  STI swab was collected today-results pending.  No indication for emergent evaluation or imaging as patient is well-appearing, afebrile, nontoxic, nontachycardic.  We will start doxycycline and metronidazole.  She was instructed to avoid prolonged sun exposure as well as alcohol while on these medications due to potential side effects.  Recommended she avoid sexual activity for 1 week after completing course of treatment (minimum of 3 weeks).  She is to rest and drink plenty of fluid.  Discussed that ultimately she will need to follow-up with an OB/GYN and she was given contact information for local provider with instruction to call to schedule an appointment soon as possible.  Discussed that if she has any worsening symptoms including pelvic pain, abdominal pain, fever, nausea, vomiting she needs to go to the emergency room immediately.  Strict return precautions given.  Work excuse note provided.  Final Clinical Impressions(s) / UC Diagnoses   Final diagnoses:  PID (acute pelvic inflammatory disease)  Vaginal irritation     Discharge Instructions      We are treating you for pelvic inflammatory disease.  Take antibiotics as prescribed.  Stay out of the sun while on doxycycline.  Do not drink any alcohol while on metronidazole and for 3 days after completing course as it will cause you to vomit.  We will contact you if we need to arrange additional treatment based on your swab results.  You should abstain from sex for 7 days after completing treatment (minimum 3 weeks).  Make sure you are  resting and drinking plenty of fluid.  Follow-up with OB/GYN; call to schedule appointment as soon as possible.  If anything worsens and you develop fever, nausea, vomiting, pelvic pain, weakness you need to go to the emergency room immediately.     ED Prescriptions     Medication Sig Dispense Auth. Provider   metroNIDAZOLE (FLAGYL) 500 MG tablet  (Status: Discontinued) Take 1 tablet (500 mg total) by mouth 2 (two) times daily. 28 tablet Marvyn Torrez K, PA-C   doxycycline (VIBRAMYCIN) 100 MG capsule  (Status: Discontinued) Take 1 capsule (100 mg total) by mouth 2 (two) times daily. 28 capsule Ruthmary Occhipinti K, PA-C   doxycycline (VIBRAMYCIN) 100 MG capsule Take 1 capsule (100 mg total) by mouth 2 (two) times daily. 28 capsule Roderica Cathell K, PA-C   metroNIDAZOLE (FLAGYL) 500 MG tablet Take 1 tablet (500 mg total) by mouth 2 (two) times daily. 28 tablet Helga Asbury, Noberto Retort, PA-C      PDMP not reviewed this encounter.   Jeani Hawking, PA-C 05/06/22 1941

## 2022-05-07 LAB — CERVICOVAGINAL ANCILLARY ONLY
Bacterial Vaginitis (gardnerella): POSITIVE — AB
Candida Glabrata: NEGATIVE
Candida Vaginitis: NEGATIVE
Chlamydia: NEGATIVE
Comment: NEGATIVE
Comment: NEGATIVE
Comment: NEGATIVE
Comment: NEGATIVE
Comment: NEGATIVE
Comment: NORMAL
Neisseria Gonorrhea: NEGATIVE
Trichomonas: NEGATIVE

## 2022-05-12 LAB — POCT URINALYSIS DIPSTICK, ED / UC
Bilirubin Urine: NEGATIVE
Glucose, UA: NEGATIVE mg/dL
Hgb urine dipstick: NEGATIVE
Ketones, ur: NEGATIVE mg/dL
Nitrite: NEGATIVE
Protein, ur: NEGATIVE mg/dL
Specific Gravity, Urine: 1.02 (ref 1.005–1.030)
Urobilinogen, UA: 0.2 mg/dL (ref 0.0–1.0)
pH: 6 (ref 5.0–8.0)

## 2022-06-28 ENCOUNTER — Ambulatory Visit (INDEPENDENT_AMBULATORY_CARE_PROVIDER_SITE_OTHER): Payer: Medicaid Other

## 2022-06-28 ENCOUNTER — Encounter (HOSPITAL_COMMUNITY): Payer: Self-pay

## 2022-06-28 ENCOUNTER — Ambulatory Visit (HOSPITAL_COMMUNITY)
Admission: EM | Admit: 2022-06-28 | Discharge: 2022-06-28 | Disposition: A | Discharge status: Home / Self Care | Payer: Medicaid Other | Attending: Family Medicine | Admitting: Family Medicine

## 2022-06-28 DIAGNOSIS — Z1152 Encounter for screening for COVID-19: Secondary | ICD-10-CM | POA: Insufficient documentation

## 2022-06-28 DIAGNOSIS — J4521 Mild intermittent asthma with (acute) exacerbation: Secondary | ICD-10-CM

## 2022-06-28 DIAGNOSIS — J189 Pneumonia, unspecified organism: Secondary | ICD-10-CM

## 2022-06-28 LAB — RESP PANEL BY RT-PCR (FLU A&B, COVID) ARPGX2
Influenza A by PCR: POSITIVE — AB
Influenza B by PCR: NEGATIVE
SARS Coronavirus 2 by RT PCR: NEGATIVE

## 2022-06-28 MED ORDER — PREDNISONE 20 MG PO TABS: 40.0000 mg | ORAL_TABLET | Freq: Every day | ORAL | 0 refills | Status: AC

## 2022-06-28 MED ORDER — ALBUTEROL SULFATE HFA 108 (90 BASE) MCG/ACT IN AERS: 2.0000 | INHALATION_SPRAY | RESPIRATORY_TRACT | 0 refills | Status: DC | PRN

## 2022-06-28 MED ORDER — ONDANSETRON 4 MG PO TBDP: 4.0000 mg | ORAL_TABLET | Freq: Three times a day (TID) | ORAL | 0 refills | Status: DC | PRN

## 2022-06-28 MED ORDER — ALBUTEROL SULFATE (2.5 MG/3ML) 0.083% IN NEBU
INHALATION_SOLUTION | RESPIRATORY_TRACT | Status: AC
  Filled 2022-06-28: qty 3

## 2022-06-28 MED ORDER — DOXYCYCLINE HYCLATE 100 MG PO CAPS: 100.0000 mg | ORAL_CAPSULE | Freq: Two times a day (BID) | ORAL | 0 refills | Status: AC

## 2022-06-28 MED ORDER — ALBUTEROL SULFATE (2.5 MG/3ML) 0.083% IN NEBU
2.5000 mg | INHALATION_SOLUTION | Freq: Once | RESPIRATORY_TRACT | Status: AC
  Administered 2022-06-28: 2.5 mg via RESPIRATORY_TRACT

## 2022-06-28 NOTE — ED Provider Notes (Addendum)
[place]    CSN: [id number] Arrival date & time: 06/28/22  1027      History   Chief Complaint Chief Complaint  Patient presents with   Cough   Emesis   Back Pain   Headache   Sore Throat    HPI Whitney Hickman is a 21 y.o. female.    Cough Associated symptoms: headaches   Emesis Associated symptoms: cough and headaches   Back Pain Associated symptoms: headaches   Headache Associated symptoms: back pain, cough and vomiting   Sore Throat Associated symptoms include headaches.   Here for nasal congestion and headache, shortness of breath and wheezing, and nausea and vomiting.  Symptoms began yesterday.  She is thrown up about 4 or 5 times since yesterday.  She reports a history of asthma.  Past Medical History:  Diagnosis Date   PID (acute pelvic inflammatory disease)     Patient Active Problem List   Diagnosis Date Noted   History of ectopic pregnancy 07/15/2021    Past Surgical History:  Procedure Laterality Date   DIAGNOSTIC LAPAROSCOPY WITH REMOVAL OF ECTOPIC PREGNANCY Right 06/12/2021   Procedure: EXPLORATORY  LAPAROSCOPY WITH REMOVAL OF ECTOPIC PREGNANCY;  Surgeon: Aletha Halim, MD;  Location: [facility];  Service: Gynecology;  Laterality: Right;   NO PAST SURGERIES      OB History     Gravida  2   Para  1   Term  1   Preterm  0   AB  1   Living  1      SAB  0   IAB  0   Ectopic  1   Multiple      Live Births  1            Home Medications    Prior to Admission medications   Medication Sig Start Date End Date Taking? Authorizing Provider  albuterol (VENTOLIN HFA) 108 (90 Base) MCG/ACT inhaler Inhale 2 puffs into the lungs every 4 (four) hours as needed for wheezing or shortness of breath. 06/28/22  Yes Barrett Henle, MD  doxycycline (VIBRAMYCIN) 100 MG capsule Take 1 capsule (100 mg total) by mouth 2 (two) times daily for 7 days. 06/28/22 07/05/22 Yes Trinadee Verhagen, Gwenlyn Perking, MD  ondansetron (ZOFRAN-ODT) 4 MG  disintegrating tablet Take 1 tablet (4 mg total) by mouth every 8 (eight) hours as needed for nausea or vomiting. 06/28/22  Yes Barrett Henle, MD  predniSONE (DELTASONE) 20 MG tablet Take 2 tablets (40 mg total) by mouth daily with breakfast for 5 days. 06/28/22 07/03/22 Yes Barrett Henle, MD    Family History History reviewed. No pertinent family history.  Social History Social History   Tobacco Use   Smoking status: Some Days    Types: Cigars   Smokeless tobacco: Never  Vaping Use   Vaping Use: Never used  Substance Use Topics   Alcohol use: Not Currently   Drug use: Not Currently    Types: Marijuana     Allergies   Patient has no known allergies.   Review of Systems Review of Systems  Respiratory:  Positive for cough.   Gastrointestinal:  Positive for vomiting.  Musculoskeletal:  Positive for back pain.  Neurological:  Positive for headaches.     Physical Exam Triage Vital Signs ED Triage Vitals  Enc Vitals Group     BP --      Pulse Rate 06/28/22 1232 100     Resp 06/28/22 1232 18  Temp 06/28/22 1232 98.1 F (36.7 C)     Temp Source 06/28/22 1232 Oral     SpO2 06/28/22 1232 94 %     Weight --      Height --      Head Circumference --      Peak Flow --      Pain Score 06/28/22 1230 10     Pain Loc --      Pain Edu? --      Excl. in [place]? --    No data found.  Updated Vital Signs Pulse 100   Temp 98.1 F (36.7 C) (Oral)   Resp 18   SpO2 94%   Visual Acuity Right Eye Distance:   Left Eye Distance:   Bilateral Distance:    Right Eye Near:   Left Eye Near:    Bilateral Near:     Physical Exam Vitals reviewed.  Constitutional:      Comments: On initial exam her O2 sat on room air is 94% and she is experiencing extra work of breathing.  HENT:     Right Ear: Tympanic membrane and ear canal normal.     Left Ear: Tympanic membrane and ear canal normal.     Nose: Nose normal.     Mouth/Throat:     Mouth: Mucous membranes are moist.   Eyes:     Extraocular Movements: Extraocular movements intact.     Conjunctiva/sclera: Conjunctivae normal.     Pupils: Pupils are equal, round, and reactive to light.  Cardiovascular:     Rate and Rhythm: Normal rate and regular rhythm.     Heart sounds: No murmur heard. Pulmonary:     Effort: Pulmonary effort is normal. No respiratory distress.     Breath sounds: No stridor. No rhonchi or rales.     Comments: I cannot hear any wheezes initially, but expiratory breath sounds are reduced bilaterally. Musculoskeletal:     Cervical back: Neck supple.  Lymphadenopathy:     Cervical: No cervical adenopathy.  Skin:    Capillary Refill: Capillary refill takes less than 2 seconds.     Coloration: Skin is not jaundiced or pale.  Neurological:     General: No focal deficit present.     Mental Status: She is alert and oriented to person, place, and time.  Psychiatric:        Behavior: Behavior normal.      UC Treatments / Results  Labs (all labs ordered are listed, but only abnormal results are displayed) Labs Reviewed  RESP PANEL BY RT-PCR (FLU A&B, COVID) [id number]    EKG   Radiology DG Chest 2 View  Result Date: 06/28/2022 CLINICAL DATA:  Cough and wheezing EXAM: CHEST - 2 VIEW COMPARISON:  None Available. FINDINGS: The heart, hila, and mediastinum are normal. Mild interstitial opacities in the bases. No focal infiltrate. No other acute abnormalities. IMPRESSION: Mild hazy interstitial opacities in the bases suggesting bronchitis or an atypical infection. No focal infiltrate. Electronically Signed   By: Dorise Bullion III M.D.   On: 06/28/2022 13:18    Procedures Procedures (including critical care time)  Medications Ordered in UC Medications  albuterol (PROVENTIL) (2.5 MG/3ML) 0.083% nebulizer solution 2.5 mg (2.5 mg Nebulization Given 06/28/22 1245)    Initial Impression / Assessment and Plan / UC Course  I have reviewed the triage vital signs and the nursing  notes.  Pertinent labs & imaging results that were available during my care of the patient were reviewed  by me and considered in my medical decision making (see chart for details).       After initial nebulizer treatment, patient is reclining on the exam table and talking in complete sentences on the phone.  When I asked her how she feels she states that the nebulizer has not helped.  I have noted to her that she was not able to lie flat and she was talking in 2 word sentences previously.  After nebulizer treatment, she is moving air much better, though now I can hear a few low pitched wheezes bilaterally.   Chest x-ray shows possible early infiltrate.  Treatment is sent for pneumonia and for asthma exacerbation.  Also treatment is sent with Zofran for her nausea and vomiting, so that she can keep medications down.  Tested for COVID and flu.  If her flu is positive she is a candidate for Tamiflu and if her COVID is positive she is a candidate for Paxlovid.  Last EGFR was greater than 60. Final Clinical Impressions(s) / UC Diagnoses   Final diagnoses:  Pneumonia due to infectious organism, unspecified laterality, unspecified part of lung  Mild intermittent asthma with acute exacerbation     Discharge Instructions      X-ray shows some possible early pneumonia.  Ondansetron dissolved in the mouth every 8 hours as needed for nausea or vomiting.  Take this medication before you try to take anything else orally, so it will help your nausea and vomiting. Clear liquids and bland things to eat.  Albuterol inhaler--do 2 puffs every 4 hours as needed for shortness of breath or wheezing  Take doxycycline 100 mg --1 capsule 2 times daily for 7 days; this is the antibiotic  Take prednisone 20 mg--2 daily for 5 days; this is for inflammation in your lungs       ED Prescriptions     Medication Sig Dispense Auth. Provider   albuterol (VENTOLIN HFA) 108 (90 Base) MCG/ACT inhaler Inhale 2  puffs into the lungs every 4 (four) hours as needed for wheezing or shortness of breath. 1 each Barrett Henle, MD   doxycycline (VIBRAMYCIN) 100 MG capsule Take 1 capsule (100 mg total) by mouth 2 (two) times daily for 7 days. 14 capsule Marleena Shubert, Gwenlyn Perking, MD   ondansetron (ZOFRAN-ODT) 4 MG disintegrating tablet Take 1 tablet (4 mg total) by mouth every 8 (eight) hours as needed for nausea or vomiting. 10 tablet Barrett Henle, MD   predniSONE (DELTASONE) 20 MG tablet Take 2 tablets (40 mg total) by mouth daily with breakfast for 5 days. 10 tablet Windy Carina Gwenlyn Perking, MD      PDMP not reviewed this encounter.   Barrett Henle, MD 06/28/22 1406    Barrett Henle, MD 06/28/22 [phone]

## 2022-06-28 NOTE — ED Triage Notes (Signed)
Pt is here for cough, vomiting , nausea, headache , back pain, sore throat, chest congestion, unable to keep fluids and food down, chills, body aches , low energy [id number] [id number]

## 2022-06-28 NOTE — Discharge Instructions (Signed)
X-ray shows some possible early pneumonia.  Ondansetron dissolved in the mouth every 8 hours as needed for nausea or vomiting.  Take this medication before you try to take anything else orally, so it will help your nausea and vomiting. Clear liquids and bland things to eat.  Albuterol inhaler--do 2 puffs every 4 hours as needed for shortness of breath or wheezing  Take doxycycline 100 mg --1 capsule 2 times daily for 7 days; this is the antibiotic  Take prednisone 20 mg--2 daily for 5 days; this is for inflammation in your lungs

## 2022-07-09 ENCOUNTER — Ambulatory Visit (HOSPITAL_COMMUNITY)
Admission: RE | Admit: 2022-07-09 | Discharge: 2022-07-09 | Disposition: A | Discharge status: Home / Self Care | Payer: Medicaid Other | Source: Ambulatory Visit | Attending: Internal Medicine | Admitting: Internal Medicine

## 2022-07-09 ENCOUNTER — Encounter (HOSPITAL_COMMUNITY): Payer: Self-pay

## 2022-07-09 VITALS — BP 120/62 | HR 76 | Temp 98.7°F | Resp 18

## 2022-07-09 DIAGNOSIS — N739 Female pelvic inflammatory disease, unspecified: Secondary | ICD-10-CM | POA: Insufficient documentation

## 2022-07-09 DIAGNOSIS — R051 Acute cough: Secondary | ICD-10-CM | POA: Insufficient documentation

## 2022-07-09 LAB — POCT URINALYSIS DIPSTICK, ED / UC
Bilirubin Urine: NEGATIVE
Glucose, UA: NEGATIVE mg/dL
Hgb urine dipstick: NEGATIVE
Ketones, ur: NEGATIVE mg/dL
Leukocytes,Ua: NEGATIVE
Nitrite: NEGATIVE
Protein, ur: NEGATIVE mg/dL
Specific Gravity, Urine: 1.025 (ref 1.005–1.030)
Urobilinogen, UA: 0.2 mg/dL (ref 0.0–1.0)
pH: 6.5 (ref 5.0–8.0)

## 2022-07-09 LAB — POC URINE PREG, ED: Preg Test, Ur: NEGATIVE

## 2022-07-09 MED ORDER — CEFTRIAXONE SODIUM 500 MG IJ SOLR
INTRAMUSCULAR | Status: AC
  Filled 2022-07-09: qty 500

## 2022-07-09 MED ORDER — LIDOCAINE HCL (PF) 1 % IJ SOLN
INTRAMUSCULAR | Status: AC
  Filled 2022-07-09: qty 2

## 2022-07-09 MED ORDER — PSEUDOEPH-BROMPHEN-DM 30-2-10 MG/5ML PO SYRP: 5.0000 mL | ORAL_SOLUTION | Freq: Four times a day (QID) | ORAL | 0 refills | Status: DC | PRN

## 2022-07-09 MED ORDER — CEFTRIAXONE SODIUM 500 MG IJ SOLR
500.0000 mg | INTRAMUSCULAR | Status: DC
  Administered 2022-07-09: 500 mg via INTRAMUSCULAR

## 2022-07-09 MED ORDER — DOXYCYCLINE HYCLATE 100 MG PO CAPS: 100.0000 mg | ORAL_CAPSULE | Freq: Two times a day (BID) | ORAL | 0 refills | Status: AC

## 2022-07-09 MED ORDER — METRONIDAZOLE 500 MG PO TABS: 500.0000 mg | ORAL_TABLET | Freq: Two times a day (BID) | ORAL | 0 refills | Status: AC

## 2022-07-09 NOTE — ED Provider Notes (Signed)
[facility]    CSN: [id number] Arrival date & time: 07/09/22  1634      History   Chief Complaint Chief Complaint  Patient presents with   Abdominal Pain   Cough    HPI Whitney Hickman is a 21 y.o. female.   21 year old female with a known history of prior PID including an ectopic pregnancy and a right salpingectomy, presents today with a 3-day history of left lower quadrant/pelvic pain.  States it started after having sex 3 days ago, and has been persistent since.  States she will occasionally get sharp shooting pain that goes over to the right side.  Denies any heavy lifting, or trauma.  Reports that her clip feels swollen and irritated.  Denies any itching, does report some urinary frequency, however denies dysuria, hematuria, flank pain, fever.  Is not currently on birth control, is trying to get pregnant.  Last menstrual period started November 24, she states she is due for her next menstrual period around December 21. Has been having normal bowel movements, last one was a few hours ago and normal. Additionally, patient states that she has a continued cough.  Was recently diagnosed with pneumonia, and completed all the antibiotics.  Is still having an intermittent dry cough.   Abdominal Pain Associated symptoms: cough   Cough   Past Medical History:  Diagnosis Date   PID (acute pelvic inflammatory disease)     Patient Active Problem List   Diagnosis Date Noted   History of ectopic pregnancy 07/15/2021    Past Surgical History:  Procedure Laterality Date   DIAGNOSTIC LAPAROSCOPY WITH REMOVAL OF ECTOPIC PREGNANCY Right 06/12/2021   Procedure: EXPLORATORY  LAPAROSCOPY WITH REMOVAL OF ECTOPIC PREGNANCY;  Surgeon: Wade Bing, MD;  Location: [facility];  Service: Gynecology;  Laterality: Right;   NO PAST SURGERIES      OB History     Gravida  2   Para  1   Term  1   Preterm  0   AB  1   Living  1      SAB  0   IAB  0   Ectopic  1    Multiple      Live Births  1            Home Medications    Prior to Admission medications   Medication Sig Start Date End Date Taking? Authorizing Provider  albuterol (VENTOLIN HFA) 108 (90 Base) MCG/ACT inhaler Inhale 2 puffs into the lungs every 4 (four) hours as needed for wheezing or shortness of breath. 06/28/22  Yes Zenia Resides, MD  brompheniramine-pseudoephedrine-DM 30-2-10 MG/5ML syrup Take 5 mLs by mouth 4 (four) times daily as needed. 07/09/22  Yes Vincenta Steffey L, PA  doxycycline (VIBRAMYCIN) 100 MG capsule Take 1 capsule (100 mg total) by mouth 2 (two) times daily for 14 days. 07/09/22 07/23/22 Yes Lothar Prehn L, PA  metroNIDAZOLE (FLAGYL) 500 MG tablet Take 1 tablet (500 mg total) by mouth 2 (two) times daily for 28 days. 07/09/22 08/06/22 Yes Amneet Cendejas, Jodelle Gross, PA    Family History History reviewed. No pertinent family history.  Social History Social History   Tobacco Use   Smoking status: Some Days    Types: Cigars   Smokeless tobacco: Never  Vaping Use   Vaping Use: Never used  Substance Use Topics   Alcohol use: Not Currently   Drug use: Yes    Types: Marijuana     Allergies  Patient has no known allergies.   Review of Systems Review of Systems  Respiratory:  Positive for cough.   Gastrointestinal:  Positive for abdominal pain.  As per HPI   Physical Exam Triage Vital Signs ED Triage Vitals  Enc Vitals Group     BP 07/09/22 1655 120/62     Pulse Rate 07/09/22 1655 76     Resp 07/09/22 1655 18     Temp 07/09/22 1655 98.7 F (37.1 C)     Temp Source 07/09/22 1655 Oral     SpO2 07/09/22 1655 100 %     Weight --      Height --      Head Circumference --      Peak Flow --      Pain Score 07/09/22 1651 10     Pain Loc --      Pain Edu? --      Excl. in GC? --    No data found.  Updated Vital Signs BP 120/62 (BP Location: Left Arm)   Pulse 76   Temp 98.7 F (37.1 C) (Oral)   Resp 18   LMP 06/19/2022 (Exact Date)   SpO2  100%   Visual Acuity Right Eye Distance:   Left Eye Distance:   Bilateral Distance:    Right Eye Near:   Left Eye Near:    Bilateral Near:     Physical Exam Vitals and nursing note reviewed.  Constitutional:      General: She is not in acute distress.    Appearance: She is well-developed and normal weight. She is not ill-appearing, toxic-appearing or diaphoretic.  HENT:     Head: Normocephalic and atraumatic.     Mouth/Throat:     Mouth: Mucous membranes are moist.     Pharynx: Oropharynx is clear. No pharyngeal swelling or oropharyngeal exudate.  Abdominal:     General: Abdomen is flat. Bowel sounds are normal. There is no distension. There are no signs of injury.     Palpations: Abdomen is soft. There is no hepatomegaly, splenomegaly or mass.     Tenderness: There is abdominal tenderness in the suprapubic area. There is no right CVA tenderness or left CVA tenderness.  Genitourinary:    Vagina: Normal.     Cervix: Cervical motion tenderness present.     Uterus: Tender. Not enlarged.      Adnexa:        Left: Tenderness present. No mass or fullness.       Rectum: Normal.     Comments: Minimally enlarged clitoris without discharge, erythema or warmth Skin:    General: Skin is warm.     Capillary Refill: Capillary refill takes less than 2 seconds.     Coloration: Skin is not pale.     Findings: No erythema or rash.  Neurological:     General: No focal deficit present.     Mental Status: She is alert and oriented to person, place, and time.  Psychiatric:        Mood and Affect: Mood normal.        Behavior: Behavior normal.      UC Treatments / Results  Labs (all labs ordered are listed, but only abnormal results are displayed) Labs Reviewed  POCT URINALYSIS DIPSTICK, ED / UC  POC URINE PREG, ED  CERVICOVAGINAL ANCILLARY ONLY    EKG   Radiology No results found.  Procedures Procedures (including critical care time)  Medications Ordered in UC Medications   cefTRIAXone (  ROCEPHIN) injection 500 mg (500 mg Intramuscular Given 07/09/22 1737)    Initial Impression / Assessment and Plan / UC Course  I have reviewed the triage vital signs and the nursing notes.  Pertinent labs & imaging results that were available during my care of the patient were reviewed by me and considered in my medical decision making (see chart for details).     PID - cervical motion tenderness in combination with presentation highly suggestive of PID.  Pregnancy test negative, urinalysis normal.  Will send patient home with Doxy and Flagyl, IM Rocephin today.  Must avoid intercourse until treatment completed.  If positive for GC, chlamydia, or trichomoniasis, partner will also need to be treated. Cough -symptomatic support as patient recently diagnosed and treated for flu and pneumonia.  Final Clinical Impressions(s) / UC Diagnoses   Final diagnoses:  Female pelvic inflammatory disease  Acute cough     Discharge Instructions      Your urinalysis is normal. Your pregnancy test is negative. Your symptoms are concerning for pelvic inflammatory disease, which is usually an infection that causes pain and inflammation of the uterus and fallopian tubes.  Please take the antibiotics as prescribed.  Do not resume intercourse until instructed to do so.  Should you develop new or worsening symptoms, please return to clinic or head to the emergency room. Keep your follow-up appointment with your OB as scheduled.  For your cough, please take the medication called in today on an as needed basis.     ED Prescriptions     Medication Sig Dispense Auth. Provider   metroNIDAZOLE (FLAGYL) 500 MG tablet Take 1 tablet (500 mg total) by mouth 2 (two) times daily for 28 days. 56 tablet Parmvir Boomer L, PA   doxycycline (VIBRAMYCIN) 100 MG capsule Take 1 capsule (100 mg total) by mouth 2 (two) times daily for 14 days. 28 capsule Geanette Buonocore L, PA    brompheniramine-pseudoephedrine-DM 30-2-10 MG/5ML syrup Take 5 mLs by mouth 4 (four) times daily as needed. 120 mL Oak Dorey L, PA      PDMP not reviewed this encounter.   Maretta Bees, [place] 07/09/22 1811

## 2022-07-09 NOTE — ED Triage Notes (Signed)
Pt states she has LLQ abdominal pain worse after sex, she also feels like she is constipated when she has that pain.   She feels like she has a knot on her clitoris as well which is new.   She has been urinating more often but no burning, or discharge, since starting back having sex with her boyfriend. She is trying to get pregnant and has a appt with her OBGYN.   Pt had flu and pneumonia and she is finished with meds and still has a cough.

## 2022-07-09 NOTE — Discharge Instructions (Addendum)
Your urinalysis is normal. Your pregnancy test is negative. Your symptoms are concerning for pelvic inflammatory disease, which is usually an infection that causes pain and inflammation of the uterus and fallopian tubes.  Please take the antibiotics as prescribed.  Do not resume intercourse until instructed to do so.  Should you develop new or worsening symptoms, please return to clinic or head to the emergency room. Keep your follow-up appointment with your OB as scheduled.  For your cough, please take the medication called in today on an as needed basis.

## 2022-07-10 ENCOUNTER — Telehealth (HOSPITAL_COMMUNITY): Payer: Self-pay | Admitting: Emergency Medicine

## 2022-07-10 LAB — CERVICOVAGINAL ANCILLARY ONLY
Bacterial Vaginitis (gardnerella): NEGATIVE
Candida Glabrata: NEGATIVE
Candida Vaginitis: POSITIVE — AB
Chlamydia: NEGATIVE
Comment: NEGATIVE
Comment: NEGATIVE
Comment: NEGATIVE
Comment: NEGATIVE
Comment: NEGATIVE
Comment: NORMAL
Neisseria Gonorrhea: NEGATIVE
Trichomonas: NEGATIVE

## 2022-07-10 MED ORDER — FLUCONAZOLE 150 MG PO TABS: 150.0000 mg | ORAL_TABLET | Freq: Once | ORAL | 0 refills | Status: AC

## 2022-08-03 ENCOUNTER — Ambulatory Visit: Payer: Medicaid Other | Admitting: Obstetrics and Gynecology

## 2022-12-30 ENCOUNTER — Encounter (HOSPITAL_COMMUNITY): Payer: Self-pay

## 2022-12-30 ENCOUNTER — Inpatient Hospital Stay (HOSPITAL_COMMUNITY): Admission: RE | Admit: 2022-12-30 | Payer: Self-pay | Source: Ambulatory Visit

## 2022-12-30 ENCOUNTER — Ambulatory Visit (HOSPITAL_COMMUNITY)
Admission: EM | Admit: 2022-12-30 | Discharge: 2022-12-30 | Disposition: A | Discharge status: Home / Self Care | Payer: Medicaid Other | Attending: Emergency Medicine | Admitting: Emergency Medicine

## 2022-12-30 DIAGNOSIS — N760 Acute vaginitis: Secondary | ICD-10-CM | POA: Insufficient documentation

## 2022-12-30 DIAGNOSIS — Z3202 Encounter for pregnancy test, result negative: Secondary | ICD-10-CM | POA: Diagnosis present

## 2022-12-30 LAB — POCT URINE PREGNANCY: Preg Test, Ur: NEGATIVE

## 2022-12-30 NOTE — Discharge Instructions (Addendum)
Your urine pregnancy test was negative.  We have swabbed you today for sexually transmitted infections we will contact you if we need to modify your treatment.  I am going ahead and treating you for bacterial vaginosis due to your symptoms and recurrent history.  To help prevent BV in the future you can wear cotton underwear, urinate or shower after intercourse, clean with unscented soap externally, no antibiotic, take an over-the-counter probiotic supplement for women's health that contains lactobacillus.  For your ongoing issues with getting pregnant, I suggest following up with an OB/GYN.  I have attached information, please give them a call.

## 2022-12-30 NOTE — ED Provider Notes (Addendum)
[facility]    CSN: [id number] Arrival date & time: 12/30/22  1732      History   Chief Complaint Chief Complaint  Patient presents with   Exposure to STD    HPI Whitney Hickman is a 22 y.o. female.   Patient presents to clinic for concerns over some vaginal itching and irritation that is started for the past few days.  She is shaved recently and noticed a painful bump to her labia as well.  Denies any vaginal odor, dysuria, fever or flank pain.  Reports that she thinks she has BV, history of same.  Would also like a pregnancy test, she is concerned over her fertility because she has been having unprotected sex for the past year and has not gotten pregnant from this partner, or her previous partner.  Hx of BV, PID, and ectopic pregnancy.   The history is provided by the patient and medical records.  Exposure to STD    Past Medical History:  Diagnosis Date   PID (acute pelvic inflammatory disease)     Patient Active Problem List   Diagnosis Date Noted   History of ectopic pregnancy 07/15/2021    Past Surgical History:  Procedure Laterality Date   DIAGNOSTIC LAPAROSCOPY WITH REMOVAL OF ECTOPIC PREGNANCY Right 06/12/2021   Procedure: EXPLORATORY  LAPAROSCOPY WITH REMOVAL OF ECTOPIC PREGNANCY;  Surgeon: Byron Bing, MD;  Location: [facility];  Service: Gynecology;  Laterality: Right;   NO PAST SURGERIES      OB History     Gravida  2   Para  1   Term  1   Preterm  0   AB  1   Living  1      SAB  0   IAB  0   Ectopic  1   Multiple      Live Births  1            Home Medications    Prior to Admission medications   Medication Sig Start Date End Date Taking? Authorizing Provider  albuterol (VENTOLIN HFA) 108 (90 Base) MCG/ACT inhaler Inhale 2 puffs into the lungs every 4 (four) hours as needed for wheezing or shortness of breath. 06/28/22   Zenia Resides, MD  brompheniramine-pseudoephedrine-DM 30-2-10 MG/5ML syrup Take 5 mLs  by mouth 4 (four) times daily as needed. 07/09/22   Maretta Bees, PA    Family History History reviewed. No pertinent family history.  Social History Social History   Tobacco Use   Smoking status: Some Days    Types: Cigars   Smokeless tobacco: Never  Vaping Use   Vaping Use: Never used  Substance Use Topics   Alcohol use: Not Currently   Drug use: Yes    Types: Marijuana     Allergies   Patient has no known allergies.   Review of Systems Review of Systems  Genitourinary:  Negative for genital sores and menstrual problem.     Physical Exam Triage Vital Signs ED Triage Vitals [12/30/22 1801]  Enc Vitals Group     BP 138/74     Pulse Rate 84     Resp 18     Temp 98.7 F (37.1 C)     Temp Source Oral     SpO2 98 %     Weight      Height      Head Circumference      Peak Flow      Pain Score  Pain Loc      Pain Edu?      Excl. in GC?    No data found.  Updated Vital Signs BP 138/74 (BP Location: Left Arm)   Pulse 84   Temp 98.7 F (37.1 C) (Oral)   Resp 18   LMP 12/10/2022   SpO2 98%   Visual Acuity Right Eye Distance:   Left Eye Distance:   Bilateral Distance:    Right Eye Near:   Left Eye Near:    Bilateral Near:     Physical Exam Vitals and nursing note reviewed. Exam conducted with a chaperone present.  Constitutional:      Appearance: Normal appearance.  HENT:     Head: Normocephalic and atraumatic.     Right Ear: External ear normal.     Left Ear: External ear normal.     Nose: Nose normal.     Mouth/Throat:     Mouth: Mucous membranes are moist.  Eyes:     Conjunctiva/sclera: Conjunctivae normal.  Cardiovascular:     Rate and Rhythm: Normal rate.  Pulmonary:     Effort: Pulmonary effort is normal. No respiratory distress.  Genitourinary:    General: Normal vulva.     Exam position: Lithotomy position.     Pubic Area: No rash or pubic lice.        Comments: Small indurated area to right labia majora.  Patient did  recently shave, suspect ingrown hair.  No open sores or vesicular lesions. Musculoskeletal:        General: No swelling. Normal range of motion.  Skin:    General: Skin is warm and dry.  Neurological:     General: No focal deficit present.     Mental Status: She is alert and oriented to person, place, and time.  Psychiatric:        Mood and Affect: Mood normal.        Behavior: Behavior normal.      UC Treatments / Results  Labs (all labs ordered are listed, but only abnormal results are displayed) Labs Reviewed  POCT URINE PREGNANCY  CERVICOVAGINAL ANCILLARY ONLY    EKG   Radiology No results found.  Procedures Procedures (including critical care time)  Medications Ordered in UC Medications - No data to display  Initial Impression / Assessment and Plan / UC Course  I have reviewed the triage vital signs and the nursing notes.  Pertinent labs & imaging results that were available during my care of the patient were reviewed by me and considered in my medical decision making (see chart for details).  Vitals in triage reviewed, patient is hemodynamically stable.  Small indurated area to right labia majora is tender, patient recently shaved, suspect ingrown hair.  No history of herpes, no open vesicular lesions.  Advised warm compress and to refrain from shaving.  No indication for oral antibiotics at this time, afebrile, without streaking, erythema or systemic involvement.  Vaginal symptoms consistent with BV, recurrent history of same.  Denies nausea, vomiting and flank pain, low concern for pelvic inflammatory disease, no abdominal pain.  Will cover for BV, cytology swab obtained and will contact if we need to modify treatment.  Urine pregnancy negative in clinic, encouraged to follow-up with OB/GYN if patient desires pregnancy.  Plan of care, follow-up care and return precautions given, no questions at this time.      Final Clinical Impressions(s) / UC Diagnoses   Final  diagnoses:  Vaginitis and vulvovaginitis  Urine  pregnancy test negative     Discharge Instructions      Your urine pregnancy test was negative.  We have swabbed you today for sexually transmitted infections we will contact you if we need to modify your treatment.  I am going ahead and treating you for bacterial vaginosis due to your symptoms and recurrent history.  To help prevent BV in the future you can wear cotton underwear, urinate or shower after intercourse, clean with unscented soap externally, no antibiotic, take an over-the-counter probiotic supplement for women's health that contains lactobacillus.  For your ongoing issues with getting pregnant, I suggest following up with an OB/GYN.  I have attached information, please give them a call.       ED Prescriptions   None    PDMP not reviewed this encounter.   Wayne Wicklund, [place] N, FNP 12/30/22 1851    Avonell Lenig, [place], [place] 12/30/22 [phone]

## 2022-12-30 NOTE — ED Triage Notes (Signed)
Pt reports painful bump on her vagina x 2 days.  Pt is requesting sti-testing today.

## 2022-12-31 LAB — CERVICOVAGINAL ANCILLARY ONLY
Bacterial Vaginitis (gardnerella): POSITIVE — AB
Candida Glabrata: NEGATIVE
Candida Vaginitis: POSITIVE — AB
Chlamydia: NEGATIVE
Comment: NEGATIVE
Comment: NEGATIVE
Comment: NEGATIVE
Comment: NEGATIVE
Comment: NEGATIVE
Comment: NORMAL
Neisseria Gonorrhea: NEGATIVE
Trichomonas: NEGATIVE

## 2023-01-02 ENCOUNTER — Telehealth (HOSPITAL_COMMUNITY): Payer: Self-pay | Admitting: Emergency Medicine

## 2023-01-02 MED ORDER — METRONIDAZOLE 500 MG PO TABS: 500.0000 mg | ORAL_TABLET | Freq: Two times a day (BID) | ORAL | 0 refills | Status: DC

## 2023-01-02 MED ORDER — FLUCONAZOLE 150 MG PO TABS: 150.0000 mg | ORAL_TABLET | Freq: Once | ORAL | 0 refills | Status: AC

## 2023-02-08 ENCOUNTER — Ambulatory Visit (HOSPITAL_COMMUNITY)
Admission: RE | Admit: 2023-02-08 | Discharge: 2023-02-08 | Disposition: A | Discharge status: Home / Self Care | Payer: Medicaid Other | Source: Ambulatory Visit | Attending: Internal Medicine | Admitting: Internal Medicine

## 2023-02-08 ENCOUNTER — Encounter (HOSPITAL_COMMUNITY): Payer: Self-pay

## 2023-02-08 VITALS — BP 126/77 | HR 82 | Temp 99.1°F | Resp 16

## 2023-02-08 DIAGNOSIS — N760 Acute vaginitis: Secondary | ICD-10-CM | POA: Insufficient documentation

## 2023-02-08 MED ORDER — METRONIDAZOLE 500 MG PO TABS: 500.0000 mg | ORAL_TABLET | Freq: Two times a day (BID) | ORAL | 0 refills | Status: DC

## 2023-02-08 NOTE — ED Provider Notes (Signed)
[facility]    CSN: [id number] Arrival date & time: 02/08/23  1432      History   Chief Complaint Chief Complaint  Patient presents with   Vaginal Discharge    HPI Whitney Hickman is a 22 y.o. female.   Patient presents to urgent care for evaluation of vaginal pain, sensation of vaginal swelling, and vaginal odor that started a few days ago.  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  She is unsure about any vaginal discharge due to the fact that she is currently on her menstrual cycle.  History of PID.  No recent antibiotic/steroid use other than Flagyl last month on December 30, 2022.  Tolerating food and fluids well without nausea, vomiting, diarrhea, urinary symptoms, or dizziness.  No fever or chills.  She would like to be evaluated for a few "bumps" to the upper vaginal area that showed up a few days ago.  Recently shaved her pubic hair and wonders if this could be the cause of external vaginal irritation.   Vaginal Discharge   Past Medical History:  Diagnosis Date   PID (acute pelvic inflammatory disease)     Patient Active Problem List   Diagnosis Date Noted   History of ectopic pregnancy 07/15/2021    Past Surgical History:  Procedure Laterality Date   DIAGNOSTIC LAPAROSCOPY WITH REMOVAL OF ECTOPIC PREGNANCY Right 06/12/2021   Procedure: EXPLORATORY  LAPAROSCOPY WITH REMOVAL OF ECTOPIC PREGNANCY;  Surgeon: Hobart Bing, MD;  Location: [facility];  Service: Gynecology;  Laterality: Right;   NO PAST SURGERIES      OB History     Gravida  2   Para  1   Term  1   Preterm  0   AB  1   Living  1      SAB  0   IAB  0   Ectopic  1   Multiple      Live Births  1            Home Medications    Prior to Admission medications   Medication Sig Start Date End Date Taking? Authorizing Provider  metroNIDAZOLE (FLAGYL) 500 MG tablet Take 1 tablet (500 mg total) by mouth 2 (two) times  daily. 02/08/23  Yes Carlisle Beers, FNP  albuterol (VENTOLIN HFA) 108 (90 Base) MCG/ACT inhaler Inhale 2 puffs into the lungs every 4 (four) hours as needed for wheezing or shortness of breath. 06/28/22   Zenia Resides, MD  brompheniramine-pseudoephedrine-DM 30-2-10 MG/5ML syrup Take 5 mLs by mouth 4 (four) times daily as needed. 07/09/22   Maretta Bees, PA    Family History History reviewed. No pertinent family history.  Social History Social History   Tobacco Use   Smoking status: Some Days    Types: Cigars   Smokeless tobacco: Never  Vaping Use   Vaping status: Never Used  Substance Use Topics   Alcohol use: Not Currently   Drug use: Yes    Types: Marijuana     Allergies   Patient has no known allergies.   Review of Systems Review of Systems  Genitourinary:  Positive for vaginal discharge.  Per HPI   Physical Exam Triage Vital Signs ED Triage Vitals [02/08/23 1448]  Encounter Vitals Group     BP 126/77     Systolic BP Percentile      Diastolic BP Percentile      Pulse Rate  82     Resp 16     Temp 99.1 F (37.3 C)     Temp Source Oral     SpO2 98 %     Weight      Height      Head Circumference      Peak Flow      Pain Score      Pain Loc      Pain Education      Exclude from Growth Chart    No data found.  Updated Vital Signs BP 126/77 (BP Location: Left Arm)   Pulse 82   Temp 99.1 F (37.3 C) (Oral)   Resp 16   LMP 02/08/2023   SpO2 98%   Visual Acuity Right Eye Distance:   Left Eye Distance:   Bilateral Distance:    Right Eye Near:   Left Eye Near:    Bilateral Near:     Physical Exam Vitals and nursing note reviewed.  Constitutional:      Appearance: She is not ill-appearing or toxic-appearing.  HENT:     Head: Normocephalic and atraumatic.     Right Ear: Hearing and external ear normal.     Left Ear: Hearing and external ear normal.     Nose: Nose normal.     Mouth/Throat:     Lips: Pink.  Eyes:     General:  Lids are normal. Vision grossly intact. Gaze aligned appropriately.     Extraocular Movements: Extraocular movements intact.     Conjunctiva/sclera: Conjunctivae normal.  Pulmonary:     Effort: Pulmonary effort is normal.  Genitourinary:    General: Normal vulva.     Vagina: No vaginal discharge.     Comments: No appreciable vaginal rash, folliculitis, or ulcerative lesions. Musculoskeletal:     Cervical back: Neck supple.  Skin:    General: Skin is warm and dry.     Capillary Refill: Capillary refill takes less than 2 seconds.     Findings: No rash.  Neurological:     General: No focal deficit present.     Mental Status: She is alert and oriented to person, place, and time. Mental status is at baseline.     Cranial Nerves: No dysarthria or facial asymmetry.  Psychiatric:        Mood and Affect: Mood normal.        Speech: Speech normal.        Behavior: Behavior normal.        Thought Content: Thought content normal.        Judgment: Judgment normal.      UC Treatments / Results  Labs (all labs ordered are listed, but only abnormal results are displayed) Labs Reviewed  CERVICOVAGINAL ANCILLARY ONLY    EKG   Radiology No results found.  Procedures Procedures (including critical care time)  Medications Ordered in UC Medications - No data to display  Initial Impression / Assessment and Plan / UC Course  I have reviewed the triage vital signs and the nursing notes.  Pertinent labs & imaging results that were available during my care of the patient were reviewed by me and considered in my medical decision making (see chart for details).   1.  Vaginitis and vulvovaginitis Evaluation suggests bacterial vaginitis etiology based on presentation and history of same. Will go ahead and treat empirically for BV. Vaginal swab pending, will treat for other infections based on swab results.  Flagyl sent, discussed risks of disulfiram reaction.  Discussed tips for BV  prevention, encouraged OB/GYN follow-up should recurrent infections occur or symptoms fail to improve.   Counseled patient on potential for adverse effects with medications prescribed/recommended today, strict ER and return-to-clinic precautions discussed, patient verbalized understanding.    Final Clinical Impressions(s) / UC Diagnoses   Final diagnoses:  Vaginitis and vulvovaginitis     Discharge Instructions      Your STD labs are pending.  We will call you if any of your results are positive and treat you at that time.  If you do not receive a phone call from [place], this means that your results are negative or there is no change to the treatment plan.  Avoid sexual intercourse until your STD labs come back to prevent spread of STDs.   I suspect that your vaginal discharge is due to bacterial vaginitis.  Take Flagyl twice daily for the next 7 days to treat this vaginal infection.  Do not drink alcohol while taking this medicine and for 72 hours after finishing this course of antibiotics as it could cause you to become very sick and vomit if you do drink alcohol while taking it.  Tips for preventing recurrent bacterial vaginitis: Stop washing genital area with anything but plain water.  Let genital area air out at night and sleep without underpants on. Wear cotton underpants and change them midday if needed.  This will help to prevent moisture to the general area.  Avoid fragranced laundry detergents and use Free and clear detergents.  Return to urgent care as needed and follow-up with your primary care provider for further evaluation and management of your symptoms..  I hope you feel better!       ED Prescriptions     Medication Sig Dispense Auth. Provider   metroNIDAZOLE (FLAGYL) 500 MG tablet Take 1 tablet (500 mg total) by mouth 2 (two) times daily. 14 tablet Carlisle Beers, FNP      PDMP not reviewed this encounter.   Carlisle Beers, [place] 02/08/23 [phone]

## 2023-02-08 NOTE — Discharge Instructions (Addendum)
Your STD labs are pending.  We will call you if any of your results are positive and treat you at that time.  If you do not receive a phone call from us, this means that your results are negative or there is no change to the treatment plan.  Avoid sexual intercourse until your STD labs come back to prevent spread of STDs.   I suspect that your vaginal discharge is due to bacterial vaginitis.  Take Flagyl twice daily for the next 7 days to treat this vaginal infection.  Do not drink alcohol while taking this medicine and for 72 hours after finishing this course of antibiotics as it could cause you to become very sick and vomit if you do drink alcohol while taking it.  Tips for preventing recurrent bacterial vaginitis: Stop washing genital area with anything but plain water.  Let genital area air out at night and sleep without underpants on. Wear cotton underpants and change them midday if needed.  This will help to prevent moisture to the general area.  Avoid fragranced laundry detergents and use Free and clear detergents.  Return to urgent care as needed and follow-up with your primary care provider for further evaluation and management of your symptoms..  I hope you feel better!   

## 2023-02-08 NOTE — ED Triage Notes (Signed)
Pt is here for vaginal discharge. Pt reports a history of BV.

## 2023-02-09 LAB — CERVICOVAGINAL ANCILLARY ONLY
Bacterial Vaginitis (gardnerella): NEGATIVE
Candida Glabrata: NEGATIVE
Candida Vaginitis: POSITIVE — AB
Chlamydia: NEGATIVE
Comment: NEGATIVE
Comment: NEGATIVE
Comment: NEGATIVE
Comment: NEGATIVE
Comment: NEGATIVE
Comment: NORMAL
Neisseria Gonorrhea: NEGATIVE
Trichomonas: NEGATIVE

## 2023-02-10 ENCOUNTER — Telehealth (HOSPITAL_COMMUNITY): Payer: Self-pay | Admitting: Emergency Medicine

## 2023-02-10 MED ORDER — FLUCONAZOLE 150 MG PO TABS: 150.0000 mg | ORAL_TABLET | Freq: Once | ORAL | 0 refills | Status: AC

## 2023-03-16 ENCOUNTER — Ambulatory Visit (HOSPITAL_COMMUNITY)
Admission: RE | Admit: 2023-03-16 | Discharge: 2023-03-16 | Disposition: A | Discharge status: Home / Self Care | Payer: Medicaid Other | Source: Ambulatory Visit | Attending: Family Medicine | Admitting: Family Medicine

## 2023-03-16 ENCOUNTER — Encounter (HOSPITAL_COMMUNITY): Payer: Self-pay

## 2023-03-16 VITALS — BP 111/63 | HR 67 | Temp 98.3°F | Resp 16

## 2023-03-16 DIAGNOSIS — J069 Acute upper respiratory infection, unspecified: Secondary | ICD-10-CM

## 2023-03-16 DIAGNOSIS — J4521 Mild intermittent asthma with (acute) exacerbation: Secondary | ICD-10-CM

## 2023-03-16 MED ORDER — PREDNISONE 20 MG PO TABS: 40.0000 mg | ORAL_TABLET | Freq: Every day | ORAL | 0 refills | Status: DC

## 2023-03-16 MED ORDER — PROMETHAZINE-DM 6.25-15 MG/5ML PO SYRP: 5.0000 mL | ORAL_SOLUTION | Freq: Four times a day (QID) | ORAL | 0 refills | Status: DC | PRN

## 2023-03-16 NOTE — ED Triage Notes (Signed)
Pt c/o cough, runny nose, chills, and body aches x3 days. Took OTC meds with no relief.

## 2023-03-20 NOTE — ED Provider Notes (Signed)
[facility]   [id number] 03/16/23 Arrival Time: 1528  ASSESSMENT & PLAN:  1. Viral URI with cough   2. Mild intermittent asthma with exacerbation    Discussed typical duration of likely viral illness. OTC symptom care as needed.  Discharge Medication List as of 03/16/2023  4:57 PM     START taking these medications   Details  predniSONE (DELTASONE) 20 MG tablet Take 2 tablets (40 mg total) by mouth daily., Starting Tue 03/16/2023, Normal    promethazine-dextromethorphan (PROMETHAZINE-DM) 6.25-15 MG/5ML syrup Take 5 mLs by mouth 4 (four) times daily as needed for cough., Starting Tue 03/16/2023, Normal         Follow-up Information     [facility].   Specialty: Urgent Care Why: If worsening or failing to improve as anticipated. Contact information: [address] [place] [address] [phone]                Reviewed expectations re: course of current medical issues. Questions answered. Outlined signs and symptoms indicating need for more acute intervention. Understanding verbalized. After Visit Summary given.   SUBJECTIVE: History from: Patient. Whitney Hickman is a 22 y.o. female. Reports: Pt c/o cough, runny nose, chills, and body aches x3 days. Took OTC meds with no relief.  Denies: fever. Normal PO intake without n/v/d.  OBJECTIVE:  Vitals:   03/16/23 1606  BP: 111/63  Pulse: 67  Resp: 16  Temp: 98.3 F (36.8 C)  SpO2: 96%    General appearance: alert; no distress Eyes: PERRLA; EOMI; conjunctiva normal HENT: [place]; AT; with nasal congestion Neck: supple  Lungs: speaks full sentences without difficulty; unlabored; bilat wheezing (mild to mod) Extremities: no edema Skin: warm and dry Neurologic: normal gait Psychological: alert and cooperative; normal mood and affect   No Known Allergies  Past Medical History:  Diagnosis Date   PID (acute pelvic inflammatory disease)    Social  History   Socioeconomic History   Marital status: Single    Spouse name: Not on file   Number of children: 1   Years of education: 12   Highest education level: High school graduate  Occupational History   Not on file  Tobacco Use   Smoking status: Some Days    Types: Cigars   Smokeless tobacco: Never  Vaping Use   Vaping status: Never Used  Substance and Sexual Activity   Alcohol use: Not Currently   Drug use: Yes    Types: Marijuana   Sexual activity: Yes    Birth control/protection: None  Other Topics Concern   Not on file  Social History Narrative   ** Merged History Encounter **       Social Determinants of Health   Financial Resource Strain: Not on File (11/19/2021)   Received from [employer], [employer]    Financial Resource Strain: 0  Food Insecurity: Not on File (11/19/2021)   Received from [place], [place]   Food Insecurity    Food: 0  Transportation Needs: Not on File (11/19/2021)   Received from [employer], [employer] Needs    Transportation: 0  Physical Activity: Not on File (11/19/2021)   Received from [place], [place]   Physical Activity    Physical Activity: 0  Stress: Not on File (11/19/2021)   Received from [facility], [place]   Stress    Stress: 0  Social Connections: Not on File (11/19/2021)   Received from [place], [place]  Social Connections    Social Connections and Isolation: 0  Intimate Partner Violence: Not on file   History reviewed. No pertinent family history. Past Surgical History:  Procedure Laterality Date   DIAGNOSTIC LAPAROSCOPY WITH REMOVAL OF ECTOPIC PREGNANCY Right 06/12/2021   Procedure: EXPLORATORY  LAPAROSCOPY WITH REMOVAL OF ECTOPIC PREGNANCY;  Surgeon: Irwindale Bing, MD;  Location: [facility] OR;  Service: Gynecology;  Laterality: Right;   NO PAST SURGERIES       Mardella Layman, MD 03/20/23 1017

## 2023-03-31 ENCOUNTER — Ambulatory Visit (HOSPITAL_COMMUNITY)
Admission: RE | Admit: 2023-03-31 | Discharge: 2023-03-31 | Disposition: A | Discharge status: Home / Self Care | Payer: Medicaid Other | Source: Ambulatory Visit | Attending: Sports Medicine | Admitting: Sports Medicine

## 2023-03-31 ENCOUNTER — Encounter (HOSPITAL_COMMUNITY): Payer: Self-pay

## 2023-03-31 MED ORDER — NEOMYCIN-POLYMYXIN-HC 3.5-10000-1 OT SUSP: 4.0000 [drp] | Freq: Three times a day (TID) | OTIC | 0 refills | Status: DC

## 2023-03-31 MED ORDER — CYCLOBENZAPRINE HCL 10 MG PO TABS: 10.0000 mg | ORAL_TABLET | Freq: Three times a day (TID) | ORAL | 0 refills | Status: DC | PRN

## 2023-03-31 NOTE — Discharge Instructions (Addendum)
Please work on the hamstring and quadricep exercises provided to you in your visit summary packet.  I expect that the tightness and pain will improve over the next couple weeks. Please use heating pad for discomfort. I also sent you cyclobenzaprine (Flexeril) to take up to every 8 hours for muscle spasms.  Please avoid operating heavy machinery with this medication as it can be sedating as we discussed.  Regarding your left ear pain: I sent in eardrops to your pharmacy to use 3 times daily for the next 7 to 10 days.  If pain is resolved within 7 days you can stop there.

## 2023-03-31 NOTE — ED Provider Notes (Signed)
[facility]    CSN: [id number] Arrival date & time: 03/31/23  1008    History   Chief Complaint Chief Complaint  Patient presents with   Leg Pain   Otalgia    HPI Whitney Hickman is a 22 y.o. female here for evaluation of right leg pain and left ear pain.  She recently had a viral infection over the past couple weeks and states that in the process of getting over this she has developed pain in her right anterior and posterior thigh.  Pain is fairly constant though worsens with hip flexion and extension.  No prior injuries that she is aware of.  No prior itching or surgeries to this leg.  She has tried Tylenol and ibuprofen without any relief.  Stretching helps.  Regarding her left ear she states that pain has been present for the past week or so.  It is worse when she is using a Q-tip or earbuds.  She denies any drainage.  Pain present inside and on the posterior aspect of the ear.  Leg Pain Otalgia   Past Medical History:  Diagnosis Date   PID (acute pelvic inflammatory disease)     Patient Active Problem List   Diagnosis Date Noted   History of ectopic pregnancy 07/15/2021    Past Surgical History:  Procedure Laterality Date   DIAGNOSTIC LAPAROSCOPY WITH REMOVAL OF ECTOPIC PREGNANCY Right 06/12/2021   Procedure: EXPLORATORY  LAPAROSCOPY WITH REMOVAL OF ECTOPIC PREGNANCY;  Surgeon: McHenry Bing, MD;  Location: [facility] OR;  Service: Gynecology;  Laterality: Right;   NO PAST SURGERIES      OB History     Gravida  2   Para  1   Term  1   Preterm  0   AB  1   Living  1      SAB  0   IAB  0   Ectopic  1   Multiple      Live Births  1            Home Medications    Prior to Admission medications   Not on File    Family History History reviewed. No pertinent family history.  Social History Social History   Tobacco Use   Smoking status: Some Days    Types: Cigars   Smokeless tobacco: Never  Vaping Use   Vaping status: Never  Used  Substance Use Topics   Alcohol use: Not Currently   Drug use: Yes    Types: Marijuana     Allergies   Patient has no known allergies.   Review of Systems Review of Systems  HENT:  Positive for ear pain.      Physical Exam Triage Vital Signs ED Triage Vitals  Encounter Vitals Group     BP 03/31/23 1024 114/63     Systolic BP Percentile --      Diastolic BP Percentile --      Pulse Rate 03/31/23 1024 62     Resp 03/31/23 1024 18     Temp 03/31/23 1024 98.5 F (36.9 C)     Temp Source 03/31/23 1024 Oral     SpO2 03/31/23 1024 97 %     Weight --      Height --      Head Circumference --      Peak Flow --      Pain Score 03/31/23 1025 5     Pain Loc --      Pain  Education --      Exclude from [employer] Chart --    No data found.  Updated Vital Signs BP 114/63 (BP Location: Left Arm)   Pulse 62   Temp 98.5 F (36.9 C) (Oral)   Resp 18   LMP 03/09/2023   SpO2 97%   Physical Exam General: Well-appearing female in no acute distress. HEENT: Pupils equal and reactive to light.  Left ear with erythematous and edematous ear canal.  No purulent drainage.  Left TM reflective and normal.  Positive tragus pain.  Positive posterior auricle pain.  No pain with palpation of the mastoid. Respiratory: Symmetric chest rise, breathing comfortably Right leg MSK exam: No visible deformity, swelling.  Right hip range of motion full.  Hamstrings tight.  Reproducible quadricep pain and hamstring pain with resisted hip flexion and extension.  Tender to palpation across the quadricep and hamstrings.  Negative intra-articular FABER and FADIR.  No pain with palpation over the greater trochanter on the right.  Right knee full range of motion without discomfort.  Negative straight leg raise regarding radicular symptoms though does not reproduce hamstring pain.  UC Treatments / Results  Labs (all labs ordered are listed, but only abnormal results are displayed) Labs Reviewed - No data to  display  EKG  Radiology No results found.  Procedures Procedures (including critical care time)  Medications Ordered in UC Medications - No data to display  Initial Impression / Assessment and Plan / UC Course  I have reviewed the triage vital signs and the nursing notes.  Pertinent labs & imaging results that were available during my care of the patient were reviewed by me and considered in my medical decision making (see chart for details).   Right leg pain consistent with muscle spasms and tightness of her hamstring and quadriceps.  I provided her with home exercise program for both the quadricep and hamstring to complete over the next handful of weeks.  Advised heating pad for as needed discomfort.  Also sent a prescription for cyclobenzaprine 10 mg as needed up to every 8 hours and instructed her on appropriate usage and sedating nature of the medication.  She is feeling to improve would recommend sports medicine follow-up.  Left ear pain consistent with otitis externa.  Advised her to keep the ear clean and dry and to complete a 7 to 10-day course of topical antibiotic and steroid.  Prescription for neomycin-polymyxin-dexamethasone drops sent to her pharmacy.  Patient is stable for discharge from urgent care and is aware of return precautions.   Marisa Cyphers, MD 03/31/23 1114

## 2023-03-31 NOTE — ED Triage Notes (Signed)
Pt c/o rt buttocks pain radiating down rt leg and lt ear pain since she was seen here on 8/20. Denies injury. States her cough went away today prior to arrival. Took advil with no relief.

## 2023-04-15 ENCOUNTER — Encounter (HOSPITAL_COMMUNITY): Payer: Self-pay | Admitting: *Deleted

## 2023-04-15 ENCOUNTER — Ambulatory Visit (HOSPITAL_COMMUNITY)
Admission: EM | Admit: 2023-04-15 | Discharge: 2023-04-15 | Disposition: A | Discharge status: Home / Self Care | Payer: Medicaid Other | Attending: Internal Medicine | Admitting: Internal Medicine

## 2023-04-15 DIAGNOSIS — N912 Amenorrhea, unspecified: Secondary | ICD-10-CM

## 2023-04-15 DIAGNOSIS — M5441 Lumbago with sciatica, right side: Secondary | ICD-10-CM

## 2023-04-15 DIAGNOSIS — N898 Other specified noninflammatory disorders of vagina: Secondary | ICD-10-CM

## 2023-04-15 DIAGNOSIS — Z113 Encounter for screening for infections with a predominantly sexual mode of transmission: Secondary | ICD-10-CM | POA: Diagnosis present

## 2023-04-15 LAB — POCT URINE PREGNANCY: Preg Test, Ur: NEGATIVE

## 2023-04-15 MED ORDER — DEXAMETHASONE SODIUM PHOSPHATE 10 MG/ML IJ SOLN
INTRAMUSCULAR | Status: AC
  Filled 2023-04-15: qty 1

## 2023-04-15 MED ORDER — METHOCARBAMOL 500 MG PO TABS: 500.0000 mg | ORAL_TABLET | Freq: Two times a day (BID) | ORAL | 0 refills | Status: DC

## 2023-04-15 MED ORDER — DEXAMETHASONE SODIUM PHOSPHATE 10 MG/ML IJ SOLN
10.0000 mg | Freq: Once | INTRAMUSCULAR | Status: AC
  Administered 2023-04-15: 10 mg via INTRAMUSCULAR

## 2023-04-15 NOTE — ED Provider Notes (Signed)
[facility]    CSN: [id number] Arrival date & time: 04/15/23  0957      History   Chief Complaint Chief Complaint  Patient presents with   Leg Pain   SEXUALLY TRANSMITTED DISEASE   Possible Pregnancy    HPI Whitney Hickman is a 22 y.o. female.   Patient presents with continued right leg pain after being seen on 9/4 for same.  Patient reports pain starts in right buttock and radiates down to foot with some numbness.  Patient also presents with with vaginal irritation during sexual intercourse and is requesting STD/STI testing. LMP 03/09/23.  Patient also requesting pregnancy test due to delayed cycle.   Leg Pain Associated symptoms: back pain   Associated symptoms: no fatigue, no fever and no neck pain   Possible Pregnancy Pertinent negatives include no abdominal pain and no headaches.    Past Medical History:  Diagnosis Date   PID (acute pelvic inflammatory disease)     Patient Active Problem List   Diagnosis Date Noted   History of ectopic pregnancy 07/15/2021    Past Surgical History:  Procedure Laterality Date   DIAGNOSTIC LAPAROSCOPY WITH REMOVAL OF ECTOPIC PREGNANCY Right 06/12/2021   Procedure: EXPLORATORY  LAPAROSCOPY WITH REMOVAL OF ECTOPIC PREGNANCY;  Surgeon: Boronda Bing, MD;  Location: [facility];  Service: Gynecology;  Laterality: Right;   NO PAST SURGERIES      OB History     Gravida  2   Para  1   Term  1   Preterm  0   AB  1   Living  1      SAB  0   IAB  0   Ectopic  1   Multiple      Live Births  1            Home Medications    Prior to Admission medications   Medication Sig Start Date End Date Taking? Authorizing Provider  methocarbamol (ROBAXIN) 500 MG tablet Take 1 tablet (500 mg total) by mouth 2 (two) times daily. 04/15/23  Yes Letta Kocher, NP    Family History History reviewed. No pertinent family history.  Social History Social History   Tobacco Use   Smoking status: Some Days     Types: Cigars   Smokeless tobacco: Never  Vaping Use   Vaping status: Never Used  Substance Use Topics   Alcohol use: Not Currently   Drug use: Yes    Types: Marijuana     Allergies   Patient has no known allergies.   Review of Systems Review of Systems  Constitutional:  Negative for chills, fatigue and fever.  Gastrointestinal:  Negative for abdominal pain, diarrhea, nausea and vomiting.  Genitourinary:  Positive for menstrual problem, vaginal discharge and vaginal pain. Negative for difficulty urinating, dysuria, flank pain, frequency, genital sores, hematuria, pelvic pain, urgency and vaginal bleeding.  Musculoskeletal:  Positive for back pain. Negative for gait problem, joint swelling and neck pain.  Neurological:  Positive for numbness. Negative for dizziness, weakness and headaches.     Physical Exam Triage Vital Signs ED Triage Vitals  Encounter Vitals Group     BP 04/15/23 1215 (!) 108/43     Systolic BP Percentile --      Diastolic BP Percentile --      Pulse Rate 04/15/23 1215 71     Resp 04/15/23 1215 18     Temp 04/15/23 1215 98.4 F (36.9 C)     Temp  Source 04/15/23 1215 Oral     SpO2 04/15/23 1215 98 %     Weight --      Height --      Head Circumference --      Peak Flow --      Pain Score 04/15/23 1213 0     Pain Loc --      Pain Education --      Exclude from Growth Chart --    No data found.  Updated Vital Signs BP (!) 108/43 (BP Location: Right Arm)   Pulse 71   Temp 98.4 F (36.9 C) (Oral)   Resp 18   LMP 03/09/2023   SpO2 98%   Visual Acuity Right Eye Distance:   Left Eye Distance:   Bilateral Distance:    Right Eye Near:   Left Eye Near:    Bilateral Near:     Physical Exam Vitals and nursing note reviewed.  Constitutional:      General: She is awake. She is not in acute distress.    Appearance: Normal appearance. She is well-developed and well-groomed. She is not ill-appearing, toxic-appearing or diaphoretic.   Cardiovascular:     Rate and Rhythm: Normal rate.     Heart sounds: Normal heart sounds.  Pulmonary:     Effort: Pulmonary effort is normal.     Breath sounds: Normal breath sounds.  Abdominal:     General: Abdomen is flat. There is no distension.     Palpations: Abdomen is soft.     Tenderness: There is no abdominal tenderness. There is no right CVA tenderness, left CVA tenderness or guarding.  Genitourinary:    Comments: Exam deferred. Musculoskeletal:        General: Tenderness present. No swelling, deformity or signs of injury. Normal range of motion.     Cervical back: Normal and normal range of motion.     Thoracic back: Normal.     Lumbar back: Tenderness present. No swelling, edema or bony tenderness. Normal range of motion.     Comments: Mild tenderness to R low back upon palpation.  Skin:    General: Skin is warm and dry.  Neurological:     General: No focal deficit present.     Mental Status: She is alert and oriented to person, place, and time.  Psychiatric:        Behavior: Behavior is cooperative.      UC Treatments / Results  Labs (all labs ordered are listed, but only abnormal results are displayed) Labs Reviewed  POCT URINE PREGNANCY - Normal  CERVICOVAGINAL ANCILLARY ONLY    EKG   Radiology No results found.  Procedures Procedures (including critical care time)  Medications Ordered in UC Medications  dexamethasone (DECADRON) injection 10 mg (10 mg Intramuscular Given 04/15/23 1257)    Initial Impression / Assessment and Plan / UC Course  I have reviewed the triage vital signs and the nursing notes.  Pertinent labs & imaging results that were available during my care of the patient were reviewed by me and considered in my medical decision making (see chart for details).     Patient presented with continued right leg pain after being seen on 9/4 for same. Patient reports pain starts in right buttock and radiates down to foot with some  numbness. Upon assessment patient has mild tenderness to right low back. No other significant findings. Given Decadron injection in clinic.  Prescribed Robaxin as needed for back/leg pain and spasms.  Recommended ibuprofen  and Tylenol as needed for pain.  Patient also presented with vaginal irritation during sexual intercourse and is requesting STD/STI testing. LMP 03/09/23.  Patient also requesting pregnancy test due to delayed cycle.  No significant findings upon assessment.  GU exam deferred.  Self swab for STD/STI performed.  Urine pregnancy negative.  Recommended following up with OB/GYN or health department concerning menstrual cycle issues.  Discussed follow-up and return precautions.  Final Clinical Impressions(s) / UC Diagnoses   Final diagnoses:  Acute right-sided low back pain with right-sided sciatica  Amenorrhea  Vaginal itching  Screening for STD (sexually transmitted disease)     Discharge Instructions      You can take Robaxin as needed for back/leg pain and spasms. Otherwise you can alternate Tylenol and Ibuprofen as needed for pain. Your results will come back over the next few days and someone will call with results and adjust treatment as needed. Follow-up with OBGYN or health department regarding menstrual cycle concerns. Return here as needed.     ED Prescriptions     Medication Sig Dispense Auth. Provider   methocarbamol (ROBAXIN) 500 MG tablet Take 1 tablet (500 mg total) by mouth 2 (two) times daily. 20 tablet Wynonia Lawman A, NP      PDMP not reviewed this encounter.   Wynonia Lawman A, NP 04/15/23 1401

## 2023-04-15 NOTE — ED Notes (Signed)
Pt states she doesn't have "good veins" so she doesn't want the labs work done today.

## 2023-04-15 NOTE — Discharge Instructions (Signed)
You can take Robaxin as needed for back/leg pain and spasms. Otherwise you can alternate Tylenol and Ibuprofen as needed for pain. Your results will come back over the next few days and someone will call with results and adjust treatment as needed. Follow-up with OBGYN or health department regarding menstrual cycle concerns. Return here as needed.

## 2023-04-15 NOTE — ED Triage Notes (Signed)
Pt here for multiple complaints today. She states that her right leg is still hurting she has been doing stretches like advised.    She states her vagina is hurting during sex and feels dry. She would like tested for BV and yeast infection.   She is 7 days late on her menstrual cycle. She took an at home test it was neg but she would like another one.

## 2023-04-16 LAB — CERVICOVAGINAL ANCILLARY ONLY
Bacterial Vaginitis (gardnerella): NEGATIVE
Candida Glabrata: NEGATIVE
Candida Vaginitis: NEGATIVE
Chlamydia: NEGATIVE
Comment: NEGATIVE
Comment: NEGATIVE
Comment: NEGATIVE
Comment: NEGATIVE
Comment: NEGATIVE
Comment: NORMAL
Neisseria Gonorrhea: NEGATIVE
Trichomonas: NEGATIVE

## 2023-04-20 ENCOUNTER — Encounter (HOSPITAL_COMMUNITY): Payer: Self-pay

## 2023-04-20 ENCOUNTER — Emergency Department (HOSPITAL_COMMUNITY)
Admission: EM | Admit: 2023-04-20 | Discharge: 2023-04-21 | Disposition: A | Discharge status: Home / Self Care | Payer: Medicaid Other

## 2023-04-20 ENCOUNTER — Ambulatory Visit (HOSPITAL_COMMUNITY)
Admission: EM | Admit: 2023-04-20 | Discharge: 2023-04-20 | Disposition: A | Discharge status: Home / Self Care | Payer: Medicaid Other | Attending: Internal Medicine | Admitting: Internal Medicine

## 2023-04-20 ENCOUNTER — Encounter (HOSPITAL_COMMUNITY): Payer: Self-pay | Admitting: Emergency Medicine

## 2023-04-20 ENCOUNTER — Other Ambulatory Visit: Payer: Self-pay

## 2023-04-20 DIAGNOSIS — N912 Amenorrhea, unspecified: Secondary | ICD-10-CM

## 2023-04-20 LAB — POCT URINE PREGNANCY: Preg Test, Ur: NEGATIVE

## 2023-04-20 MED ORDER — KETOROLAC TROMETHAMINE 30 MG/ML IJ SOLN: 30.0000 mg | Freq: Once | INTRAMUSCULAR | Status: DC

## 2023-04-20 NOTE — ED Provider Notes (Signed)
[place] EMERGENCY DEPARTMENT AT [facility] Provider Note   CSN: [id number] Arrival date & time: 04/20/23  1940     History  Chief Complaint  Patient presents with   Amenorrhea    Whitney Hickman is a 22 y.o. female.  22 year old female with no reported past medical history presenting to the emergency department today with concern for amenorrhea.  The patient states her last period was on the 13th of last month.  She reports that it is very abnormal for her to miss a period.  She states that she has done multiple home pregnancy test which were negative.  She had a pregnancy test performed at urgent care which was also negative earlier tonight.  She came in today for further evaluation regarding this.  She denies any significant abdominal pain, vaginal bleeding, or discharge.  She states that she "likes to party" and wanted to make sure that she was not pregnant.        Home Medications Prior to Admission medications   Medication Sig Start Date End Date Taking? Authorizing Provider  methocarbamol (ROBAXIN) 500 MG tablet Take 1 tablet (500 mg total) by mouth 2 (two) times daily. 04/15/23   Letta Kocher, NP      Allergies    Patient has no known allergies.    Review of Systems   Review of Systems  Genitourinary:        Amenorrhea   All other systems reviewed and are negative.   Physical Exam Updated Vital Signs BP 100/70   Pulse 66   Temp 98.2 F (36.8 C) (Oral)   Resp (!) 28   LMP 03/09/2023   SpO2 100%  Physical Exam Vitals and nursing note reviewed.   Gen: NAD Eyes: PERRL, EOMI HEENT: no oropharyngeal swelling Neck: trachea midline Resp: clear to auscultation bilaterally Card: RRR, no murmurs, rubs, or gallops Abd: nontender, nondistended Extremities: no calf tenderness, no edema Vascular: 2+ radial pulses bilaterally, 2+ DP pulses bilaterally Skin: no rashes Psyc: acting appropriately   ED Results / Procedures / Treatments    Labs (all labs ordered are listed, but only abnormal results are displayed) Labs Reviewed  PREGNANCY, URINE    EKG None  Radiology No results found.  Procedures Procedures    Medications Ordered in ED Medications - No data to display  ED Course/ Medical Decision Making/ A&P                                 Medical Decision Making 22 year old female with no reported past medical history presenting to the emergency department today with amenorrhea.  She did have a pregnancy test drawn at triage.  I will follow-up on this.  If this is unremarkable she will be discharged to follow-up with OB/GYN as an outpatient.  I have also counseled her that she may follow-up with her primary care provider.  She does not have any significant tenderness here on exam so I do not think that emergent ultrasound or further imaging is warranted.  The patient's initial urine did not have a label.  When asked if she wanted to repeat a urine pregnancy test she did not want to stay.  I will refer the patient to OB.  She has had 2 negative test today at the urgent care which I think is sufficient.  She is discharged with return precautions.  Amount and/or Complexity of Data Reviewed Labs: ordered.  Final Clinical Impression(s) / ED Diagnoses Final diagnoses:  Amenorrhea  Dispo: discharge  Rx / DC Orders ED Discharge Orders     None         Durwin Glaze, MD 04/21/23 0001

## 2023-04-20 NOTE — ED Triage Notes (Signed)
Pt reports her LMP 8/13 and now late. Pt reports that all test have been negative for pregnancy and not on birth control.   Pt also states that mediations given for her sciatica aren't helping.

## 2023-04-20 NOTE — ED Triage Notes (Signed)
Pt coming in with c/o missed menstrual cycle. Pt states her last period started 03/09/23. She states she has not had any abdominal cramping. Pt states she is not on birth control. She states she's taken at home pregnancy tests have come back negative. Endorses nausea. Denies cp, vomiting, diarrhea, SOB. Pt states UC sent her for eval to "get an ultrasound".

## 2023-04-20 NOTE — ED Provider Notes (Signed)
[facility]    CSN: [id number] Arrival date & time: 04/20/23  1709      History   Chief Complaint Chief Complaint  Patient presents with   Amenorrhea    HPI Whitney Hickman is a 22 y.o. female.   Whitney Hickman is a 22 y.o. female presenting for chief complaint of amenorrhea. LMP 03/09/2023, multiple pregnancy tests have been negative and patient is concerned that she may have an intrauterine abnormality. She is here requesting further workup to determine exactly what is causing amenorrhea. She was seen at urgent care 5 days ago where she was treated for sciatic nerve pain with steroid injection and robaxin. She was told at visit 5 days ago to follow-up with OB/GYN and provider discussed limitations of urgent care in terms of workup for amenorrhea. States she "smokes, drinks, and parties and would like to be sure she's not pregnant". History of ectopic pregnancy with removal of one fallopian tube.      Past Medical History:  Diagnosis Date   PID (acute pelvic inflammatory disease)     Patient Active Problem List   Diagnosis Date Noted   History of ectopic pregnancy 07/15/2021    Past Surgical History:  Procedure Laterality Date   DIAGNOSTIC LAPAROSCOPY WITH REMOVAL OF ECTOPIC PREGNANCY Right 06/12/2021   Procedure: EXPLORATORY  LAPAROSCOPY WITH REMOVAL OF ECTOPIC PREGNANCY;  Surgeon: [place] Bing, MD;  Location: [facility];  Service: Gynecology;  Laterality: Right;   NO PAST SURGERIES      OB History     Gravida  2   Para  1   Term  1   Preterm  0   AB  1   Living  1      SAB  0   IAB  0   Ectopic  1   Multiple      Live Births  1            Home Medications    Prior to Admission medications   Medication Sig Start Date End Date Taking? Authorizing Provider  methocarbamol (ROBAXIN) 500 MG tablet Take 1 tablet (500 mg total) by mouth 2 (two) times daily. 04/15/23   Letta Kocher, NP    Family History No family history on  file.  Social History Social History   Tobacco Use   Smoking status: Some Days    Types: Cigars   Smokeless tobacco: Never  Vaping Use   Vaping status: Never Used  Substance Use Topics   Alcohol use: Not Currently   Drug use: Yes    Types: Marijuana     Allergies   Patient has no known allergies.   Review of Systems Review of Systems Per HPI  Physical Exam Triage Vital Signs ED Triage Vitals  Encounter Vitals Group     BP 04/20/23 1852 107/69     Systolic BP Percentile --      Diastolic BP Percentile --      Pulse Rate 04/20/23 1852 71     Resp 04/20/23 1852 17     Temp 04/20/23 1852 98.6 F (37 C)     Temp Source 04/20/23 1852 Oral     SpO2 04/20/23 1852 98 %     Weight --      Height --      Head Circumference --      Peak Flow --      Pain Score 04/20/23 1851 10     Pain Loc --  Pain Education --      Exclude from Growth Chart --    No data found.  Updated Vital Signs BP 107/69 (BP Location: Left Arm)   Pulse 71   Temp 98.6 F (37 C) (Oral)   Resp 17   LMP 03/09/2023   SpO2 98%   Visual Acuity Right Eye Distance:   Left Eye Distance:   Bilateral Distance:    Right Eye Near:   Left Eye Near:    Bilateral Near:     Physical Exam Vitals and nursing note reviewed.  Constitutional:      Appearance: She is not ill-appearing or toxic-appearing.  HENT:     Head: Normocephalic and atraumatic.     Right Ear: Hearing and external ear normal.     Left Ear: Hearing and external ear normal.     Nose: Nose normal.     Mouth/Throat:     Lips: Pink.  Eyes:     General: Lids are normal. Vision grossly intact. Gaze aligned appropriately.     Extraocular Movements: Extraocular movements intact.     Conjunctiva/sclera: Conjunctivae normal.  Pulmonary:     Effort: Pulmonary effort is normal.  Musculoskeletal:     Cervical back: Neck supple.  Skin:    General: Skin is warm and dry.     Capillary Refill: Capillary refill takes less than 2  seconds.     Findings: No rash.  Neurological:     General: No focal deficit present.     Mental Status: She is alert and oriented to person, place, and time. Mental status is at baseline.     Cranial Nerves: No dysarthria or facial asymmetry.  Psychiatric:        Mood and Affect: Mood normal.        Speech: Speech normal.        Behavior: Behavior normal.        Thought Content: Thought content normal.        Judgment: Judgment normal.      UC Treatments / Results  Labs (all labs ordered are listed, but only abnormal results are displayed) Labs Reviewed  POCT URINE PREGNANCY    EKG   Radiology No results found.  Procedures Procedures (including critical care time)  Medications Ordered in UC Medications - No data to display   Initial Impression / Assessment and Plan / UC Course  I have reviewed the triage vital signs and the nursing notes.  Pertinent labs & imaging results that were available during my care of the patient were reviewed by me and considered in my medical decision making (see chart for details).   1.  Amenorrhea Patient left the clinic without finishing visit abruptly once I discussed with her that we are unable to complete appropriate workup for amenorrhea here at the urgent care due to limited resources.  I further encouraged her to follow-up with her primary care provider so that they can perform a referral to an OB/GYN to get her the appropriate care/workup given her Medicaid status.  She walked out of the room and left the clinic abruptly without completing discussion/visit.  I offered to take care of her worsening sciatic nerve pain and she chose to continue to leave the clinic before completing the visit.  Final Clinical Impressions(s) / UC Diagnoses   Final diagnoses:  Amenorrhea   Discharge Instructions   None    ED Prescriptions   None    PDMP not reviewed this encounter.  Carlisle Beers, [place] 04/20/23 1933

## 2023-04-21 NOTE — Discharge Instructions (Signed)
Please call to schedule an appointment with the women's clinic at the number provided.  You may also try to get in touch with your primary care provider to schedule a follow-up appointment for further workup.  Return to the ER for worsening symptoms.

## 2023-04-21 NOTE — ED Notes (Signed)
This provider took over care at 23:23pm. Doctor Greig Castilla asked did pt urine test come back, this provider checked and noted that the test was collected but not resulted. Pt also informed this provider that she is ready to go and she is just waiting for her urine test to return. Pt stated she did give her other other her urine past 22:00pm.  This provider called the Lab and was informed that they never received the urine, but there was a urine to come down that had no label on it therefore they did not know who to inform nor could run the test.  This provider informed the pt and asked could she urinate again, pt stated she just urinated and was unable to go again, pt also stated she gotta leave and could she get her discharge papers. Dr. Greig Castilla was informed and he prepared pt AVS. Pt had took herself off cardiac monitor and was waiting outside the door. Discharge papers were given to pt and she left ambulatory, by self, Aox4.

## 2023-04-27 ENCOUNTER — Encounter (HOSPITAL_COMMUNITY): Payer: Self-pay

## 2023-05-13 ENCOUNTER — Encounter (HOSPITAL_COMMUNITY): Payer: Self-pay

## 2023-05-13 ENCOUNTER — Ambulatory Visit (HOSPITAL_COMMUNITY)
Admission: RE | Admit: 2023-05-13 | Discharge: 2023-05-13 | Disposition: A | Discharge status: Home / Self Care | Payer: Medicaid Other | Source: Ambulatory Visit | Attending: Emergency Medicine | Admitting: Emergency Medicine

## 2023-05-13 VITALS — BP 108/73 | HR 71 | Temp 98.4°F | Resp 18

## 2023-05-13 DIAGNOSIS — H60392 Other infective otitis externa, left ear: Secondary | ICD-10-CM | POA: Insufficient documentation

## 2023-05-13 DIAGNOSIS — Z113 Encounter for screening for infections with a predominantly sexual mode of transmission: Secondary | ICD-10-CM | POA: Insufficient documentation

## 2023-05-13 DIAGNOSIS — N941 Unspecified dyspareunia: Secondary | ICD-10-CM | POA: Diagnosis present

## 2023-05-13 DIAGNOSIS — M5431 Sciatica, right side: Secondary | ICD-10-CM | POA: Insufficient documentation

## 2023-05-13 MED ORDER — OFLOXACIN 0.3 % OT SOLN: 10.0000 [drp] | Freq: Every day | OTIC | 0 refills | Status: AC

## 2023-05-13 MED ORDER — DEXAMETHASONE SODIUM PHOSPHATE 10 MG/ML IJ SOLN
INTRAMUSCULAR | Status: AC
  Filled 2023-05-13: qty 1

## 2023-05-13 MED ORDER — DEXAMETHASONE SODIUM PHOSPHATE 10 MG/ML IJ SOLN
10.0000 mg | Freq: Once | INTRAMUSCULAR | Status: AC
  Administered 2023-05-13: 10 mg via INTRAMUSCULAR

## 2023-05-13 NOTE — ED Triage Notes (Signed)
Pt states she would liek STI check. She states she doesn't have any sx its just painful when she does have sex. She states that he complains its "tight" when he goes it, so she is hurting.    She complains of right leg pain states that its her sciatica not clearing up.   She complains of left ear pain she states she cleans them all the time and uses drop but no relief.

## 2023-05-13 NOTE — ED Provider Notes (Signed)
[facility]    CSN: [id number] Arrival date & time: 05/13/23  8657      History   Chief Complaint Chief Complaint  Patient presents with   SEXUALLY TRANSMITTED DISEASE   Leg Pain   Otalgia    HPI Whitney Hickman is a 22 y.o. female.   Patient presents to clinic for multiple complaints.  She is requesting sexually-transmitted infection screening.  She denies any vaginal discharge or vaginal odor.  She has been having irritation with intercourse and this is abnormal.  Denies any dysuria, vaginal sores or vaginal lesions.  She has been having a flareup of her sciatica for months.  Feels like the pain is her entire right leg.  She did have a steroid shot and some muscle relaxers which were ineffective.  She does go to the gym and do the stair climber and does some stretching exercises. Pain worse with sitting and driving. No pain when laying down or standing.   She has been having continued left ear pain despite using antibiotic eardrops.  Denies any drainage.  Reports she cleans her ears out frequently and has continued to use the eardrops over the past month.  No fevers.   The history is provided by the patient and medical records.  Leg Pain Associated symptoms: no fever   Otalgia Associated symptoms: no ear discharge and no fever     Past Medical History:  Diagnosis Date   PID (acute pelvic inflammatory disease)     Patient Active Problem List   Diagnosis Date Noted   History of ectopic pregnancy 07/15/2021    Past Surgical History:  Procedure Laterality Date   DIAGNOSTIC LAPAROSCOPY WITH REMOVAL OF ECTOPIC PREGNANCY Right 06/12/2021   Procedure: EXPLORATORY  LAPAROSCOPY WITH REMOVAL OF ECTOPIC PREGNANCY;  Surgeon: [place] Bing, MD;  Location: [facility];  Service: Gynecology;  Laterality: Right;   NO PAST SURGERIES      OB History     Gravida  2   Para  1   Term  1   Preterm  0   AB  1   Living  1      SAB  0   IAB  0   Ectopic  1    Multiple      Live Births  1            Home Medications    Prior to Admission medications   Medication Sig Start Date End Date Taking? Authorizing Provider  ofloxacin (FLOXIN) 0.3 % OTIC solution Place 10 drops into the left ear daily for 5 days. 05/13/23 05/18/23 Yes Rinaldo Ratel, [place] N, FNP  methocarbamol (ROBAXIN) 500 MG tablet Take 1 tablet (500 mg total) by mouth 2 (two) times daily. 04/15/23   Letta Kocher, NP    Family History History reviewed. No pertinent family history.  Social History Social History   Tobacco Use   Smoking status: Some Days    Types: Cigars   Smokeless tobacco: Never  Vaping Use   Vaping status: Never Used  Substance Use Topics   Alcohol use: Not Currently   Drug use: Yes    Types: Marijuana     Allergies   Patient has no known allergies.   Review of Systems Review of Systems  Constitutional:  Negative for fever.  HENT:  Positive for ear pain. Negative for ear discharge.   Genitourinary:  Positive for dyspareunia. Negative for dysuria, genital sores, hematuria, menstrual problem, vaginal bleeding and vaginal discharge.  Physical Exam Triage Vital Signs ED Triage Vitals  Encounter Vitals Group     BP 05/13/23 1008 108/73     Systolic BP Percentile --      Diastolic BP Percentile --      Pulse Rate 05/13/23 1008 71     Resp 05/13/23 1008 18     Temp 05/13/23 1008 98.4 F (36.9 C)     Temp Source 05/13/23 1008 Oral     SpO2 05/13/23 1008 98 %     Weight --      Height --      Head Circumference --      Peak Flow --      Pain Score 05/13/23 1006 0     Pain Loc --      Pain Education --      Exclude from Growth Chart --    No data found.  Updated Vital Signs BP 108/73 (BP Location: Left Arm)   Pulse 71   Temp 98.4 F (36.9 C) (Oral)   Resp 18   LMP 04/29/2023   SpO2 98%   Visual Acuity Right Eye Distance:   Left Eye Distance:   Bilateral Distance:    Right Eye Near:   Left Eye Near:    Bilateral  Near:     Physical Exam Vitals and nursing note reviewed.  Constitutional:      Appearance: Normal appearance.  HENT:     Head: Normocephalic and atraumatic.     Left Ear: Tenderness present. No drainage.     Ears:     Comments: Patient reports tenderness to palpation of pinna and auricle manipulation of left ear, there is erythema of the external auditory canal.  Tympanic membrane intact and pearly gray.    Nose: Nose normal.     Mouth/Throat:     Mouth: Mucous membranes are moist.  Eyes:     Conjunctiva/sclera: Conjunctivae normal.  Cardiovascular:     Rate and Rhythm: Normal rate.  Pulmonary:     Effort: Pulmonary effort is normal. No respiratory distress.  Musculoskeletal:        General: Normal range of motion.  Skin:    General: Skin is warm and dry.  Neurological:     General: No focal deficit present.     Mental Status: She is alert and oriented to person, place, and time.  Psychiatric:        Behavior: Behavior is cooperative.      UC Treatments / Results  Labs (all labs ordered are listed, but only abnormal results are displayed) Labs Reviewed  CERVICOVAGINAL ANCILLARY ONLY    EKG   Radiology No results found.  Procedures Procedures (including critical care time)  Medications Ordered in UC Medications  dexamethasone (DECADRON) injection 10 mg (10 mg Intramuscular Given 05/13/23 1025)    Initial Impression / Assessment and Plan / UC Course  I have reviewed the triage vital signs and the nursing notes.  Pertinent labs & imaging results that were available during my care of the patient were reviewed by me and considered in my medical decision making (see chart for details).  Vitals and triage reviewed, patient is hemodynamically stable.  Patient with dyspareunia, STI screening obtained.  Explained what syphilis infection was.  Patient initially agreeable for HIV and syphilis screening, then declined when staff went to collect it.  Cytology swab  obtained.  Sciatica pain has been ongoing, improves with exercise.  Will give IM steroid injection and encouraged to follow-up with  sports medicine.  No red flag symptoms of incontinence or inner leg numbness.  Ambulatory with steady gait.  Atraumatic.  Left external auditory canal with erythema, pinna is tender to manipulation.  Will cover with ofloxacin, encouraged follow-up with PCP if no improvement in symptoms.  Tympanic membrane is intact and pearly gray.  Plan of care, follow-up care and return precautions given, no questions at this time.     Final Clinical Impressions(s) / UC Diagnoses   Final diagnoses:  Other infective acute otitis externa of left ear  Screening examination for sexually transmitted disease  Dyspareunia, female  Sciatica of right side     Discharge Instructions      We have screened you for sexually transmitted infections as well as bacterial vaginosis and yeast.  Our staff will contact you if anything is abnormal to start you on the appropriate treatment.  Results will also be available in MyChart.  Abstain from intercourse until all results have been received and notify partners of any positive STI testing.  We have given you a steroid injection for your sciatica.  Since this is an ongoing issue I would follow-up with your primary care and potentially a sports medicine for physical therapy and further evaluation.  You can also consider a chiropractor to see if this helps.  For your ear pain use the eardrops once daily for the next 5 days.  If no improvement follow-up with a primary care provider.  Return to clinic for any new or urgent symptoms.      ED Prescriptions     Medication Sig Dispense Auth. Provider   ofloxacin (FLOXIN) 0.3 % OTIC solution Place 10 drops into the left ear daily for 5 days. 5 mL Alizza Sacra, [place] N, FNP      PDMP not reviewed this encounter.   Manon Banbury, [place] N, [place] 05/13/23 [phone]

## 2023-05-13 NOTE — ED Notes (Signed)
Pt declined to have blood work for STI done today, provider aware.

## 2023-05-13 NOTE — Discharge Instructions (Signed)
We have screened you for sexually transmitted infections as well as bacterial vaginosis and yeast.  Our staff will contact you if anything is abnormal to start you on the appropriate treatment.  Results will also be available in MyChart.  Abstain from intercourse until all results have been received and notify partners of any positive STI testing.  We have given you a steroid injection for your sciatica.  Since this is an ongoing issue I would follow-up with your primary care and potentially a sports medicine for physical therapy and further evaluation.  You can also consider a chiropractor to see if this helps.  For your ear pain use the eardrops once daily for the next 5 days.  If no improvement follow-up with a primary care provider.  Return to clinic for any new or urgent symptoms.

## 2023-05-14 LAB — CERVICOVAGINAL ANCILLARY ONLY
Bacterial Vaginitis (gardnerella): NEGATIVE
Candida Glabrata: NEGATIVE
Candida Vaginitis: NEGATIVE
Chlamydia: NEGATIVE
Comment: NEGATIVE
Comment: NEGATIVE
Comment: NEGATIVE
Comment: NEGATIVE
Comment: NEGATIVE
Comment: NORMAL
Neisseria Gonorrhea: NEGATIVE
Trichomonas: NEGATIVE

## 2023-06-05 ENCOUNTER — Encounter (HOSPITAL_COMMUNITY): Payer: Self-pay

## 2023-06-05 ENCOUNTER — Ambulatory Visit (HOSPITAL_COMMUNITY)
Admission: EM | Admit: 2023-06-05 | Discharge: 2023-06-05 | Disposition: A | Discharge status: Home / Self Care | Payer: Medicaid Other | Attending: Emergency Medicine | Admitting: Emergency Medicine

## 2023-06-05 DIAGNOSIS — N760 Acute vaginitis: Secondary | ICD-10-CM | POA: Insufficient documentation

## 2023-06-05 DIAGNOSIS — L819 Disorder of pigmentation, unspecified: Secondary | ICD-10-CM | POA: Insufficient documentation

## 2023-06-05 DIAGNOSIS — M5431 Sciatica, right side: Secondary | ICD-10-CM | POA: Diagnosis present

## 2023-06-05 MED ORDER — DEXAMETHASONE SODIUM PHOSPHATE 10 MG/ML IJ SOLN
10.0000 mg | Freq: Once | INTRAMUSCULAR | Status: AC
  Administered 2023-06-05: 10 mg via INTRAMUSCULAR

## 2023-06-05 MED ORDER — DEXAMETHASONE SODIUM PHOSPHATE 10 MG/ML IJ SOLN
INTRAMUSCULAR | Status: AC
  Filled 2023-06-05: qty 1

## 2023-06-05 NOTE — ED Triage Notes (Signed)
Pt states that she has some ongoing leg pain (sciatic nerve). Pt states that she also has some vaginal discomfort. X5 days

## 2023-06-05 NOTE — ED Provider Notes (Signed)
[facility]    CSN: [id number] Arrival date & time: 06/05/23  1015      History   Chief Complaint Chief Complaint  Patient presents with   Leg Pain    Ongoing right leg pain    HPI Whitney Hickman is a 22 y.o. female.   Patient presents to clinic for ongoing right sided lower back pain that radiates down her right leg.  Reports recurrent history of sciatica.  She did get a steroid injection at the last visit and reports this helped her a lot.  She does go to the gym twice a week and do the stairstepper and perform stretches for sciatica.  She has not followed up with her primary care provider or with an orthopedic for this ongoing issue.  Denies any trauma or falls.  Pain was good since last visit but is flared up over the past week.  She is also having some vaginal irritation with unprotected intercourse.  Denies any vaginal discharge or odor.  Slight itching.  Denies dysuria, hematuria or urinary odor.   Reports a rash to her abdomen and areas of hyperpigmentation after the itchy spots have healed.  Has a few areas of hyperpigmentation to her cheeks.  She uses a [employer] and does not really use a moisturizer.  She has tried coconut oil in the past but this breaks her out.  The history is provided by the patient and medical records.  Leg Pain   Past Medical History:  Diagnosis Date   PID (acute pelvic inflammatory disease)     Patient Active Problem List   Diagnosis Date Noted   History of ectopic pregnancy 07/15/2021    Past Surgical History:  Procedure Laterality Date   DIAGNOSTIC LAPAROSCOPY WITH REMOVAL OF ECTOPIC PREGNANCY Right 06/12/2021   Procedure: EXPLORATORY  LAPAROSCOPY WITH REMOVAL OF ECTOPIC PREGNANCY;  Surgeon: [place] Bing, MD;  Location: [facility];  Service: Gynecology;  Laterality: Right;   NO PAST SURGERIES      OB History     Gravida  2   Para  1   Term  1   Preterm  0   AB  1   Living  1      SAB  0   IAB  0   Ectopic   1   Multiple      Live Births  1            Home Medications    Prior to Admission medications   Medication Sig Start Date End Date Taking? Authorizing Provider  methocarbamol (ROBAXIN) 500 MG tablet Take 1 tablet (500 mg total) by mouth 2 (two) times daily. 04/15/23  Yes Letta Kocher, NP    Family History History reviewed. No pertinent family history.  Social History Social History   Tobacco Use   Smoking status: Some Days    Types: Cigars   Smokeless tobacco: Never  Vaping Use   Vaping status: Never Used  Substance Use Topics   Alcohol use: Not Currently   Drug use: Yes    Types: Marijuana     Allergies   Patient has no known allergies.   Review of Systems Review of Systems  Per HPI   Physical Exam Triage Vital Signs ED Triage Vitals  Encounter Vitals Group     BP 06/05/23 1051 (!) 99/45     Systolic BP Percentile --      Diastolic BP Percentile --      Pulse Rate  06/05/23 1051 (!) 51     Resp 06/05/23 1051 17     Temp 06/05/23 1051 97.9 F (36.6 C)     Temp Source 06/05/23 1051 Oral     SpO2 06/05/23 1051 97 %     Weight 06/05/23 1048 171 lb 6.4 oz (77.7 kg)     Height 06/05/23 1048 5\' 5"  (1.651 m)     Head Circumference --      Peak Flow --      Pain Score 06/05/23 1048 0     Pain Loc --      Pain Education --      Exclude from Growth Chart --    No data found.  Updated Vital Signs BP (!) 99/45 (BP Location: Left Arm)   Pulse (!) 51   Temp 97.9 F (36.6 C) (Oral)   Resp 17   Ht 5\' 5"  (1.651 m)   Wt 171 lb 6.4 oz (77.7 kg)   LMP 05/26/2023   SpO2 97%   BMI 28.52 kg/m   Visual Acuity Right Eye Distance:   Left Eye Distance:   Bilateral Distance:    Right Eye Near:   Left Eye Near:    Bilateral Near:     Physical Exam Vitals and nursing note reviewed.  Constitutional:      Appearance: Normal appearance.  HENT:     Head: Normocephalic and atraumatic.      Comments: Area of hyperpigmentation to the right cheek.   Area is flat and without erythema.  Denies urticaria.    Right Ear: External ear normal.     Left Ear: External ear normal.     Nose: Nose normal.     Mouth/Throat:     Mouth: Mucous membranes are moist.  Eyes:     Conjunctiva/sclera: Conjunctivae normal.  Cardiovascular:     Rate and Rhythm: Normal rate.  Pulmonary:     Effort: Pulmonary effort is normal. No respiratory distress.  Musculoskeletal:        General: Normal range of motion.  Skin:    General: Skin is warm and dry.     Findings: Rash present.     Comments: Pinpoint areas of hyperpigmentation to her abdomen and thighs.  Reports these occur after itchy areas heal.  Neurological:     General: No focal deficit present.     Mental Status: She is alert.  Psychiatric:        Mood and Affect: Mood normal.        Behavior: Behavior is cooperative.      UC Treatments / Results  Labs (all labs ordered are listed, but only abnormal results are displayed) Labs Reviewed  CERVICOVAGINAL ANCILLARY ONLY    EKG   Radiology No results found.  Procedures Procedures (including critical care time)  Medications Ordered in UC Medications  dexamethasone (DECADRON) injection 10 mg (has no administration in time range)    Initial Impression / Assessment and Plan / UC Course  I have reviewed the triage vital signs and the nursing notes.  Pertinent labs & imaging results that were available during my care of the patient were reviewed by me and considered in my medical decision making (see chart for details).  Vitals and triage reviewed, patient is hemodynamically stable.  IM steroid injection given for sciatic nerve pain, encouraged stretching and Ortho follow-up.  Without red flag symptoms of inner leg numbness or gait abnormalities.  Atraumatic.  Vaginal discomfort with intercourse.  Cytology swab obtained.  Areas of hyperpigmentation after itchy rash heals.  Advised exfoliation, hypoallergenic soap, hypoallergenic  moisturizers and vitamin C serum.  Plan of care, follow-up care return precautions given, no questions at this time.     Final Clinical Impressions(s) / UC Diagnoses   Final diagnoses:  Sciatica of right side  Acute vaginitis  Hyperpigmentation of skin     Discharge Instructions      We have given you a steroid injection for your continued sciatica.  Please follow-up with [place] [facility] for further evaluation of your continued low back pain.  Continue stretching and exercising as well.  We have screened you for bacterial vaginosis, yeast, gonorrhea, chlamydia and trichomoniasis.  Our staff will contact you if results require treatment.  Abstain from intercourse until results have been received.  For your areas of hyperpigmentation I suggest a gentle exfoliation like Dove.  For your hyperpigmentation on your face you can use a gentle cleanser like CeraVe, they also have moisturizers available.  You can do a vitamin C serum to help break the spots as well.     ED Prescriptions   None    PDMP not reviewed this encounter.   Gazella Anglin, [place], [place] 06/05/23 1121

## 2023-06-05 NOTE — Discharge Instructions (Signed)
We have given you a steroid injection for your continued sciatica.  Please follow-up with [place] [facility] for further evaluation of your continued low back pain.  Continue stretching and exercising as well.  We have screened you for bacterial vaginosis, yeast, gonorrhea, chlamydia and trichomoniasis.  Our staff will contact you if results require treatment.  Abstain from intercourse until results have been received.  For your areas of hyperpigmentation I suggest a gentle exfoliation like Dove.  For your hyperpigmentation on your face you can use a gentle cleanser like CeraVe, they also have moisturizers available.  You can do a vitamin C serum to help break the spots as well.

## 2023-06-07 LAB — CERVICOVAGINAL ANCILLARY ONLY
Bacterial Vaginitis (gardnerella): NEGATIVE
Candida Glabrata: NEGATIVE
Candida Vaginitis: NEGATIVE
Chlamydia: NEGATIVE
Comment: NEGATIVE
Comment: NEGATIVE
Comment: NEGATIVE
Comment: NEGATIVE
Comment: NEGATIVE
Comment: NORMAL
Neisseria Gonorrhea: NEGATIVE
Trichomonas: NEGATIVE

## 2023-06-14 ENCOUNTER — Ambulatory Visit (INDEPENDENT_AMBULATORY_CARE_PROVIDER_SITE_OTHER): Payer: Medicaid Other

## 2023-06-14 ENCOUNTER — Encounter (HOSPITAL_COMMUNITY): Payer: Self-pay

## 2023-06-14 ENCOUNTER — Ambulatory Visit (HOSPITAL_COMMUNITY)
Admission: EM | Admit: 2023-06-14 | Discharge: 2023-06-14 | Disposition: A | Discharge status: Home / Self Care | Payer: Medicaid Other

## 2023-06-14 DIAGNOSIS — J4521 Mild intermittent asthma with (acute) exacerbation: Secondary | ICD-10-CM | POA: Diagnosis not present

## 2023-06-14 DIAGNOSIS — R051 Acute cough: Secondary | ICD-10-CM

## 2023-06-14 MED ORDER — PREDNISONE 20 MG PO TABS: 40.0000 mg | ORAL_TABLET | Freq: Every day | ORAL | 0 refills | Status: AC

## 2023-06-14 MED ORDER — PROMETHAZINE-DM 6.25-15 MG/5ML PO SYRP: 5.0000 mL | ORAL_SOLUTION | Freq: Four times a day (QID) | ORAL | 0 refills | Status: DC | PRN

## 2023-06-14 NOTE — ED Provider Notes (Signed)
[facility]    CSN: [id number] Arrival date & time: 06/14/23  1817      History   Chief Complaint Chief Complaint  Patient presents with   Sore Throat   Asthma Exacerbation    HPI Whitney Hickman is a 22 y.o. female.   Patient presents to clinic complaining of a sore throat that started on Tuesday and now she is having a cough, runny nose, sore throat, wheezing and shortness of breath.  She does have asthma and feels like it has been flaring up.  She has been using her albuterol inhaler at night and in the morning, last use this morning.  She is having chills, no fever.  Continues to have left ear pain after completing antibiotics for left ear infection.  She was recently exposed to a sick coworker.  Has taken over-the-counter cough medicine at night, it has helped a little bit.  The history is provided by the patient and medical records.  Sore Throat    Past Medical History:  Diagnosis Date   PID (acute pelvic inflammatory disease)     Patient Active Problem List   Diagnosis Date Noted   History of ectopic pregnancy 07/15/2021   [facility] care (status) 06/02/2018   Pregnancy 04/30/2016    Past Surgical History:  Procedure Laterality Date   DIAGNOSTIC LAPAROSCOPY WITH REMOVAL OF ECTOPIC PREGNANCY Right 06/12/2021   Procedure: EXPLORATORY  LAPAROSCOPY WITH REMOVAL OF ECTOPIC PREGNANCY;  Surgeon: [place] Bing, MD;  Location: [facility];  Service: Gynecology;  Laterality: Right;   NO PAST SURGERIES      OB History     Gravida  2   Para  1   Term  1   Preterm  0   AB  1   Living  1      SAB  0   IAB  0   Ectopic  1   Multiple      Live Births  1            Home Medications    Prior to Admission medications   Medication Sig Start Date End Date Taking? Authorizing Provider  fluconazole (DIFLUCAN) 150 MG tablet Take 150 mg by mouth once. 02/10/23  Yes [provider]  predniSONE (DELTASONE) 20 MG tablet Take 2 tablets (40 mg  total) by mouth daily for 5 days. 06/14/23 06/19/23 Yes Rinaldo Ratel, [place], FNP  promethazine-dextromethorphan (PROMETHAZINE-DM) 6.25-15 MG/5ML syrup Take 5 mLs by mouth 4 (four) times daily as needed for cough. 06/14/23  Yes Rinaldo Ratel, [place], FNP  methocarbamol (ROBAXIN) 500 MG tablet Take 1 tablet (500 mg total) by mouth 2 (two) times daily. 04/15/23   Letta Kocher, NP    Family History History reviewed. No pertinent family history.  Social History Social History   Tobacco Use   Smoking status: Some Days    Types: Cigars   Smokeless tobacco: Never  Vaping Use   Vaping status: Never Used  Substance Use Topics   Alcohol use: Not Currently   Drug use: Not Currently    Types: Marijuana     Allergies   Patient has no known allergies.   Review of Systems Review of Systems  Per HPI   Physical Exam Triage Vital Signs ED Triage Vitals  Encounter Vitals Group     BP 06/14/23 1937 100/66     Systolic BP Percentile --      Diastolic BP Percentile --      Pulse Rate 06/14/23 1937  78     Resp 06/14/23 1937 18     Temp 06/14/23 1937 98.8 F (37.1 C)     Temp Source 06/14/23 1937 Oral     SpO2 06/14/23 1937 98 %     Weight 06/14/23 1934 171 lb (77.6 kg)     Height 06/14/23 1934 5\' 4"  (1.626 m)     Head Circumference --      Peak Flow --      Pain Score 06/14/23 1933 8     Pain Loc --      Pain Education --      Exclude from Growth Chart --    No data found.  Updated Vital Signs BP 100/66 (BP Location: Left Arm)   Pulse 78   Temp 98.8 F (37.1 C) (Oral)   Resp 18   Ht 5\' 4"  (1.626 m)   Wt 171 lb (77.6 kg)   LMP 05/26/2023 (Exact Date)   SpO2 98%   BMI 29.35 kg/m   Visual Acuity Right Eye Distance:   Left Eye Distance:   Bilateral Distance:    Right Eye Near:   Left Eye Near:    Bilateral Near:     Physical Exam Vitals and nursing note reviewed.  Constitutional:      Appearance: Normal appearance. She is well-developed.  HENT:     Head:  Normocephalic and atraumatic.     Right Ear: Tympanic membrane, ear canal and external ear normal.     Left Ear: Tympanic membrane, ear canal and external ear normal.     Nose: Congestion and rhinorrhea present.     Mouth/Throat:     Mouth: Mucous membranes are moist.     Pharynx: Posterior oropharyngeal erythema present.     Tonsils: No tonsillar exudate or tonsillar abscesses. 1+ on the right. 1+ on the left.  Eyes:     Conjunctiva/sclera: Conjunctivae normal.  Cardiovascular:     Rate and Rhythm: Normal rate and regular rhythm.     Heart sounds: Normal heart sounds. No murmur heard. Pulmonary:     Effort: Pulmonary effort is normal. No respiratory distress.     Breath sounds: Normal breath sounds.  Musculoskeletal:        General: Normal range of motion.  Skin:    General: Skin is warm and dry.  Neurological:     General: No focal deficit present.     Mental Status: She is alert.  Psychiatric:        Mood and Affect: Mood normal.      UC Treatments / Results  Labs (all labs ordered are listed, but only abnormal results are displayed) Labs Reviewed - No data to display  EKG   Radiology No results found.  Procedures Procedures (including critical care time)  Medications Ordered in UC Medications - No data to display  Initial Impression / Assessment and Plan / UC Course  I have reviewed the triage vital signs and the nursing notes.  Pertinent labs & imaging results that were available during my care of the patient were reviewed by me and considered in my medical decision making (see chart for details).  Vitals and triage reviewed, patient is hemodynamically stable.  Congestion, rhinorrhea and posterior pharynx erythema on physical exam.  Heart with regular rate and rhythm, lungs are vesicular.  Does have a history of asthma, feels like she is in a flareup.  Imaging does not show any acute abnormalities, awaiting official radiology overread.  Will treat with steroid  burst for asthma exacerbation and cough syrup for cough.  Plan of care, follow-up care and return precautions given, no questions at this time.  Work note provided.     Final Clinical Impressions(s) / UC Diagnoses   Final diagnoses:  Mild intermittent asthma with acute exacerbation  Acute cough     Discharge Instructions      I do not see any obvious pneumonia on her x-ray, we will contact you if the official radiology interpretation is different.  Start the steroids tomorrow with breakfast and use the cough medicine as needed.  Do not drink or drive on the cough medicine as it may cause drowsiness.  You can use your albuterol inhaler as needed for any wheezing or shortness of breath.  Your symptoms should improve with medications, return to clinic if you have any changes or new concerning symptoms.     ED Prescriptions     Medication Sig Dispense Auth. Provider   promethazine-dextromethorphan (PROMETHAZINE-DM) 6.25-15 MG/5ML syrup Take 5 mLs by mouth 4 (four) times daily as needed for cough. 118 mL Rinaldo Ratel, [place] N, [place]   predniSONE (DELTASONE) 20 MG tablet Take 2 tablets (40 mg total) by mouth daily for 5 days. 10 tablet Elior Robinette, [place], FNP      PDMP not reviewed this encounter.   Anida Deol, [place] N, [place] 06/14/23 2044

## 2023-06-14 NOTE — Discharge Instructions (Addendum)
I do not see any obvious pneumonia on her x-ray, we will contact you if the official radiology interpretation is different.  Start the steroids tomorrow with breakfast and use the cough medicine as needed.  Do not drink or drive on the cough medicine as it may cause drowsiness.  You can use your albuterol inhaler as needed for any wheezing or shortness of breath.  Your symptoms should improve with medications, return to clinic if you have any changes or new concerning symptoms.

## 2023-06-14 NOTE — ED Triage Notes (Signed)
"  Started with sore throat on Tuesday now with cough and runny nose". "I have Asthma and its causing it to flare up". No wheezing. No sob. No fever.

## 2023-07-05 ENCOUNTER — Encounter (HOSPITAL_COMMUNITY): Payer: Self-pay

## 2023-07-05 ENCOUNTER — Ambulatory Visit (HOSPITAL_COMMUNITY)
Admission: RE | Admit: 2023-07-05 | Discharge: 2023-07-05 | Disposition: A | Discharge status: Home / Self Care | Payer: Medicaid Other | Source: Ambulatory Visit | Attending: Internal Medicine

## 2023-07-05 VITALS — BP 137/72 | HR 63 | Temp 98.6°F | Resp 16 | Ht 62.0 in | Wt 171.0 lb

## 2023-07-05 DIAGNOSIS — Z3A01 Less than 8 weeks gestation of pregnancy: Secondary | ICD-10-CM

## 2023-07-05 DIAGNOSIS — R051 Acute cough: Secondary | ICD-10-CM | POA: Diagnosis not present

## 2023-07-05 LAB — POCT URINE PREGNANCY: Preg Test, Ur: POSITIVE — AB

## 2023-07-05 NOTE — Discharge Instructions (Signed)
Colds while pregnant are limited to medications you are able to take here is a list of some of the over-the-counter medicines you are able to take for symptoms  Afrin nasal spray (3 days maximum) Chloraseptic throat spray Flonase Nasonex Ocean and nasal mist Pseudoephedrine Saline nasal sprays Sucrets or other throat lozenges Sudafed Tavist D Tylenol Cold & Sinus Tylenol PM Tylenol Sinus Vicks VapoRub

## 2023-07-05 NOTE — ED Triage Notes (Signed)
Patient here today with c/o cough, ST, wheeze, and SOB X 1 week. Patient states that every time she coughs, he urinates a little.   Patient would also like to be tested for pregnancy. Patient took a home test and it was positive.

## 2023-07-05 NOTE — ED Provider Notes (Signed)
[facility]    CSN: [id number] Arrival date & time: 07/05/23  1030      History   Chief Complaint Chief Complaint  Patient presents with   Possible Pregnancy   Cough    HPI Whitney Hickman is a 22 y.o. female.   Patient presents today with cough congestion wheezing x 1 week.  She was seen for similar symptoms approximately a 2 weeks ago with minimal relief.  Patient also states that she would like to have a pregnancy test she has not had a.  In 4 weeks and took a test at home which was positive.  Patient has slight nausea denies any urinary symptoms.    Past Medical History:  Diagnosis Date   PID (acute pelvic inflammatory disease)     Patient Active Problem List   Diagnosis Date Noted   History of ectopic pregnancy 07/15/2021   [facility] care (status) 06/02/2018   Pregnancy 04/30/2016    Past Surgical History:  Procedure Laterality Date   DIAGNOSTIC LAPAROSCOPY WITH REMOVAL OF ECTOPIC PREGNANCY Right 06/12/2021   Procedure: EXPLORATORY  LAPAROSCOPY WITH REMOVAL OF ECTOPIC PREGNANCY;  Surgeon: [place] Bing, MD;  Location: [facility];  Service: Gynecology;  Laterality: Right;   NO PAST SURGERIES      OB History     Gravida  2   Para  1   Term  1   Preterm  0   AB  1   Living  1      SAB  0   IAB  0   Ectopic  1   Multiple      Live Births  1            Home Medications    Prior to Admission medications   Not on File    Family History History reviewed. No pertinent family history.  Social History Social History   Tobacco Use   Smoking status: Former    Types: Cigars   Smokeless tobacco: Never  Vaping Use   Vaping status: Never Used  Substance Use Topics   Alcohol use: Not Currently   Drug use: Not Currently    Types: Marijuana     Allergies   Patient has no known allergies.   Review of Systems Review of Systems  Constitutional:  Negative for fever.  HENT:  Positive for congestion.   Respiratory:  Positive  for cough and wheezing.   Cardiovascular: Negative.   Gastrointestinal: Negative.  Negative for blood in stool, constipation, diarrhea and nausea.  Genitourinary: Negative.  Negative for decreased urine volume, frequency, hematuria, menstrual problem, pelvic pain, urgency, vaginal bleeding, vaginal discharge and vaginal pain.  Neurological: Negative.      Physical Exam Triage Vital Signs ED Triage Vitals  Encounter Vitals Group     BP 07/05/23 1048 137/72     Systolic BP Percentile --      Diastolic BP Percentile --      Pulse Rate 07/05/23 1048 63     Resp 07/05/23 1048 16     Temp 07/05/23 1048 98.6 F (37 C)     Temp Source 07/05/23 1048 Oral     SpO2 07/05/23 1048 95 %     Weight 07/05/23 1048 171 lb (77.6 kg)     Height 07/05/23 1048 5\' 2"  (1.575 m)     Head Circumference --      Peak Flow --      Pain Score 07/05/23 1047 9     Pain  Loc --      Pain Education --      Exclude from Growth Chart --    No data found.  Updated Vital Signs BP 137/72 (BP Location: Left Arm)   Pulse 63   Temp 98.6 F (37 C) (Oral)   Resp 16   Ht 5\' 2"  (1.575 m)   Wt 171 lb (77.6 kg)   LMP 05/26/2023 (Exact Date)   SpO2 95%   BMI 31.28 kg/m   Visual Acuity Right Eye Distance:   Left Eye Distance:   Bilateral Distance:    Right Eye Near:   Left Eye Near:    Bilateral Near:     Physical Exam Constitutional:      Appearance: Normal appearance.  HENT:     Right Ear: Tympanic membrane normal.     Left Ear: Tympanic membrane normal.     Nose: Congestion present.     Mouth/Throat:     Mouth: Mucous membranes are moist.  Eyes:     Pupils: Pupils are equal, round, and reactive to light.  Cardiovascular:     Rate and Rhythm: Normal rate.     Pulses: Normal pulses.     Heart sounds: Normal heart sounds.  Pulmonary:     Breath sounds: Wheezing present.  Abdominal:     General: Abdomen is flat. Bowel sounds are normal.     Tenderness: There is no right CVA tenderness or left  CVA tenderness.  Skin:    General: Skin is warm.  Neurological:     Mental Status: She is alert.      UC Treatments / Results  Labs (all labs ordered are listed, but only abnormal results are displayed) Labs Reviewed  POCT URINE PREGNANCY - Abnormal; Notable for the following components:      Result Value   Preg Test, Ur Positive (*)    All other components within normal limits    EKG   Radiology No results found.  Procedures Procedures (including critical care time)  Medications Ordered in UC Medications - No data to display  Initial Impression / Assessment and Plan / UC Course  I have reviewed the triage vital signs and the nursing notes.  Pertinent labs & imaging results that were available during my care of the patient were reviewed by me and considered in my medical decision making (see chart for details).     Patient has a positive pregnancy test Discussed with patient OB/GYN care she currently has an OB/GYN that she will contact. .  Discussed with patient medications that she can take while being pregnant With patient's past history of ectopic discussed symptoms that if she notices these she will need to be seen in the emergency room or at [facility] health   Final Clinical Impressions(s) / UC Diagnoses   Final diagnoses:  Acute cough  Less than [redacted] weeks gestation of pregnancy     Discharge Instructions      Colds while pregnant are limited to medications you are able to take here is a list of some of the over-the-counter medicines you are able to take for symptoms  Afrin nasal spray (3 days maximum) Chloraseptic throat spray Flonase Nasonex Ocean and nasal mist Pseudoephedrine Saline nasal sprays Sucrets or other throat lozenges Sudafed Tavist D Tylenol Cold & Sinus Tylenol PM Tylenol Sinus Vicks VapoRub     ED Prescriptions   None    PDMP not reviewed this encounter.   Coralyn Mark, NP 07/05/23 [phone]

## 2023-07-26 LAB — OB RESULTS CONSOLE VARICELLA ZOSTER ANTIBODY, IGG: Varicella: IMMUNE

## 2023-07-26 LAB — HEPATITIS C ANTIBODY: HCV Ab: NEGATIVE

## 2023-07-26 LAB — CYTOLOGY - PAP
Pap: NEGATIVE
Urine Culture, OB: NEGATIVE

## 2023-07-26 LAB — OB RESULTS CONSOLE GC/CHLAMYDIA
Chlamydia: NEGATIVE
Neisseria Gonorrhea: NEGATIVE

## 2023-07-26 LAB — OB RESULTS CONSOLE RUBELLA ANTIBODY, IGM: Rubella: IMMUNE

## 2023-07-26 LAB — OB RESULTS CONSOLE RPR: RPR: NONREACTIVE

## 2023-07-26 LAB — OB RESULTS CONSOLE HIV ANTIBODY (ROUTINE TESTING): HIV: NONREACTIVE

## 2023-07-26 LAB — OB RESULTS CONSOLE HEPATITIS B SURFACE ANTIGEN: Hepatitis B Surface Ag: NEGATIVE

## 2023-07-26 LAB — OB RESULTS CONSOLE ABO/RH: RH Type: POSITIVE

## 2023-07-26 LAB — OB RESULTS CONSOLE HGB/HCT, BLOOD
HCT: 41 (ref 29–41)
Hemoglobin: 13.8

## 2023-07-26 LAB — OB RESULTS CONSOLE PLATELET COUNT: Platelets: 298

## 2023-07-28 NOTE — L&D Delivery Note (Signed)
 Delivery Note At 3:57 PM a viable female was delivered via Vaginal, Spontaneous (Presentation:     OA ).  APGAR: 9, 9; weight  .   Placenta status: Spontaneous, Intact.  Cord: 3 vessels with the following complications: None.    Anesthesia: Epidural; Episiotomy: None Lacerations: None Suture Repair: na Est. Blood Loss (mL):  100 cc  Apos Rubella immune  Breast + bottle +Circ  Mom to postpartum.  Baby to Couplet care / Skin to Skin.  Whitney Hickman Inch 02/18/2024, 4:23 PM

## 2023-10-14 LAB — OB RESULTS CONSOLE HGB/HCT, BLOOD
HCT: 36 (ref 29–41)
Hemoglobin: 12.1

## 2023-11-04 ENCOUNTER — Other Ambulatory Visit: Payer: Self-pay | Admitting: Obstetrics and Gynecology

## 2023-11-04 DIAGNOSIS — Z363 Encounter for antenatal screening for malformations: Secondary | ICD-10-CM

## 2023-11-22 ENCOUNTER — Ambulatory Visit

## 2023-11-22 DIAGNOSIS — O365920 Maternal care for other known or suspected poor fetal growth, second trimester, not applicable or unspecified: Secondary | ICD-10-CM | POA: Insufficient documentation

## 2023-11-22 DIAGNOSIS — O365990 Maternal care for other known or suspected poor fetal growth, unspecified trimester, not applicable or unspecified: Secondary | ICD-10-CM | POA: Insufficient documentation

## 2023-11-24 ENCOUNTER — Ambulatory Visit: Attending: Obstetrics and Gynecology

## 2023-11-24 ENCOUNTER — Other Ambulatory Visit: Payer: Self-pay | Admitting: Obstetrics and Gynecology

## 2023-11-24 ENCOUNTER — Other Ambulatory Visit: Payer: Self-pay | Admitting: *Deleted

## 2023-11-24 ENCOUNTER — Ambulatory Visit (HOSPITAL_BASED_OUTPATIENT_CLINIC_OR_DEPARTMENT_OTHER)

## 2023-11-24 VITALS — BP 120/65 | HR 81

## 2023-11-24 DIAGNOSIS — Z362 Encounter for other antenatal screening follow-up: Secondary | ICD-10-CM

## 2023-11-24 DIAGNOSIS — Z3A26 26 weeks gestation of pregnancy: Secondary | ICD-10-CM | POA: Insufficient documentation

## 2023-11-24 DIAGNOSIS — O365920 Maternal care for other known or suspected poor fetal growth, second trimester, not applicable or unspecified: Secondary | ICD-10-CM | POA: Diagnosis not present

## 2023-11-24 DIAGNOSIS — Z363 Encounter for antenatal screening for malformations: Secondary | ICD-10-CM | POA: Insufficient documentation

## 2023-11-24 DIAGNOSIS — O99019 Anemia complicating pregnancy, unspecified trimester: Secondary | ICD-10-CM

## 2023-11-24 NOTE — Progress Notes (Signed)
 Patient information  Patient Name: Whitney Hickman  Patient MRN:   [id number]  Referring practice: MFM Referring Provider: [facility] Department [facility])  MFM CONSULT  Whitney Hickman is a 23 y.o. G3P1011 at [redacted]w[redacted]d here for ultrasound and consultation. Patient Active Problem List   Diagnosis Date Noted   Poor fetal growth affecting management of mother in second trimester 11/22/2023   History of ectopic pregnancy 07/15/2021   [facility] care (status) 06/02/2018   Pregnancy 04/30/2016   Whitney Hickman is doing well today with no acute concerns.  RE poor fetal growth: The patient was sent for suspected fetal growth restriction.  I discussed the patient's menstrual cycle history and it revealed that her menstrual cycles are not regular.  She also had a month where she had no period at all.  I discussed that the importance of proper dating and recommend she change her due date to August 11 based on her earliest ultrasound in the prenatal records.  This places the fetus at the 15th percentile overall.  Umbilical artery Dopplers were performed and are normal.  We discussed the various causes and clinical implications of poor fetal growth and the need to follow-up for repeat ultrasound in 4 weeks.  I also discussed the importance of avoiding tobacco, marijuana and other illicit substances.  The patient reports that she has discontinued all forms of smoking.  Sonographic findings Single intrauterine pregnancy at 25w 2d  Fetal cardiac activity:  Observed and appears normal. Presentation: Cephalic. The anatomic structures that were well seen appear normal without evidence of soft markers. Due to poor acoustic windows some structures remain suboptimally visualized. Fetal biometry shows the estimated fetal weight at the 15 percentile.  Amniotic fluid: Within normal limits.  MVP: 4.4 cm. Placenta: Anterior. Adnexa: No abnormality visualized. Umbilical artery dopplers findings: -S/D:3.66 which are  normal at this gestational age.  -Absent end-diastolic flow: No.  -Reversed end-diastolic flow:  No. Normal movement and tone.   There are limitations of prenatal ultrasound such as the inability to detect certain abnormalities due to poor visualization. Various factors such as fetal position, gestational age and maternal body habitus may increase the difficulty in visualizing the fetal anatomy.    Recommendations -Due to menstrual cycle irregularity the due date should be changed to 03/06/2024 which is based on her 20-week ultrasound (the earliest ultrasound I can find in the prenatal records).  - Follow-up ultrasound in 4 weeks to assess fetal biometry  Review of Systems: A review of systems was performed and was negative except per HPI   Vitals and Physical Exam    11/24/2023    7:14 AM 07/05/2023   10:48 AM 06/14/2023    7:37 PM  Vitals with BMI  Height  5\' 2"    Weight  171 lbs   BMI  31.27   Systolic 120 137 409  Diastolic 65 72 66  Pulse 81 63 78    Sitting comfortably on the sonogram table Nonlabored breathing Normal rate and rhythm Abdomen is nontender  Past pregnancies OB History  Gravida Para Term Preterm AB Living  3 1 1  0 1 1  SAB IAB Ectopic Multiple Live Births  0 0 1  1    # Outcome Date GA Lbr Len/2nd Weight Sex Type Anes PTL Lv  3 Current           2 Ectopic 05/2021          1 Term 11/25/16 [redacted]w[redacted]d 05:47 / 00:50 6 lb 0.7 oz (  2.741 kg) F Vag-Spont EPI  LIV     I spent 45 minutes reviewing the patients chart, including labs and images as well as counseling the patient about her medical conditions. Greater than 50% of the time was spent in direct face-to-face patient counseling.  Penney Bowling  MFM, [facility]   11/24/2023  8:39 AM

## 2023-12-14 LAB — OB RESULTS CONSOLE HGB/HCT, BLOOD
HCT: 36 (ref 29–41)
Hemoglobin: 12.1

## 2023-12-14 LAB — GLUCOSE TOLERANCE, 1 HOUR: Glucose Tolerance, 1 hour: 204

## 2023-12-14 LAB — OB RESULTS CONSOLE RPR: RPR: NONREACTIVE

## 2023-12-15 ENCOUNTER — Other Ambulatory Visit: Payer: Self-pay

## 2023-12-15 DIAGNOSIS — D573 Sickle-cell trait: Secondary | ICD-10-CM | POA: Insufficient documentation

## 2023-12-15 DIAGNOSIS — Z87898 Personal history of other specified conditions: Secondary | ICD-10-CM | POA: Insufficient documentation

## 2023-12-15 DIAGNOSIS — D563 Thalassemia minor: Secondary | ICD-10-CM | POA: Insufficient documentation

## 2023-12-22 ENCOUNTER — Ambulatory Visit (HOSPITAL_BASED_OUTPATIENT_CLINIC_OR_DEPARTMENT_OTHER): Admitting: Obstetrics

## 2023-12-22 ENCOUNTER — Ambulatory Visit: Attending: Obstetrics and Gynecology

## 2023-12-22 ENCOUNTER — Other Ambulatory Visit: Payer: Self-pay | Admitting: Obstetrics and Gynecology

## 2023-12-22 ENCOUNTER — Other Ambulatory Visit: Payer: Self-pay | Admitting: *Deleted

## 2023-12-22 ENCOUNTER — Other Ambulatory Visit: Payer: Self-pay

## 2023-12-22 DIAGNOSIS — O365920 Maternal care for other known or suspected poor fetal growth, second trimester, not applicable or unspecified: Secondary | ICD-10-CM | POA: Insufficient documentation

## 2023-12-22 DIAGNOSIS — O365930 Maternal care for other known or suspected poor fetal growth, third trimester, not applicable or unspecified: Secondary | ICD-10-CM | POA: Diagnosis present

## 2023-12-22 DIAGNOSIS — D573 Sickle-cell trait: Secondary | ICD-10-CM | POA: Insufficient documentation

## 2023-12-22 DIAGNOSIS — Z362 Encounter for other antenatal screening follow-up: Secondary | ICD-10-CM | POA: Insufficient documentation

## 2023-12-22 DIAGNOSIS — Z3A29 29 weeks gestation of pregnancy: Secondary | ICD-10-CM

## 2023-12-22 DIAGNOSIS — O24410 Gestational diabetes mellitus in pregnancy, diet controlled: Secondary | ICD-10-CM | POA: Diagnosis not present

## 2023-12-22 DIAGNOSIS — O24419 Gestational diabetes mellitus in pregnancy, unspecified control: Secondary | ICD-10-CM

## 2023-12-22 DIAGNOSIS — O99013 Anemia complicating pregnancy, third trimester: Secondary | ICD-10-CM

## 2023-12-22 DIAGNOSIS — O99019 Anemia complicating pregnancy, unspecified trimester: Secondary | ICD-10-CM | POA: Diagnosis present

## 2023-12-22 DIAGNOSIS — D563 Thalassemia minor: Secondary | ICD-10-CM

## 2023-12-22 NOTE — Progress Notes (Signed)
 MFM Consult Note  Whitney Hickman is currently at 29 weeks and 2 days.  She has been followed due to a small for gestational age fetus.  The patient reports that her first child was delivered at term weighing only 5 pounds.    She was recently diagnosed with gestational diabetes.  She is scheduled for diabetic teaching tomorrow.  On today's exam, the overall EFW of 2 pounds 11 ounces measures at the 11th percentile for her gestational age.    There was normal amniotic fluid noted with a total AFI of 14.85 cm.  Doppler studies of the umbilical arteries performed today continues to show normal forward flow.  There were no signs of absent or reversed end-diastolic flow.  The patient was advised that due to her prior history, she may have smaller sized babies.    Due to the small for gestational age fetus noted today, we will continue to follow her with growth ultrasounds.    She will return in 3 weeks for another growth scan.    Should IUGR (EFW less than 10th percentile) be noted during her future exams, we will initiate weekly fetal testing and weekly umbilical artery Doppler studies at that time.    The patient stated that all of her questions were answered today.  A total of 20 minutes was spent counseling and coordinating the care for this patient.  Greater than 50% of the time was spent in direct face-to-face contact.

## 2023-12-22 NOTE — Progress Notes (Unsigned)
 Patient was seen for Gestational Diabetes on 12/23/2023   Start time 0815 and End time 0930  Estimated due date: 03/06/2024; [redacted]w[redacted]d   Clinical: Medications:  Current Outpatient Medications:    aspirin EC 81 MG tablet, Take 81 mg by mouth daily. Swallow whole., Disp: , Rfl:    Prenatal Vit-Fe Fumarate-FA (PRENATAL MULTIVITAMIN) TABS tablet, Take 1 tablet by mouth daily at 12 noon., Disp: , Rfl:   Medical History:  Past Medical History:  Diagnosis Date   Asthma    PID (acute pelvic inflammatory disease)    Labs: No results found for: "HGBA1C" A1C 5.7% obtained 08/09/2023  Dietary and Lifestyle History: Pt presents today alone. Pt reports she she does the cooking and shopping and lives alone. 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 Pt reports she is taking an iron supplement daily and states unknown dose.  Pt reports lactose intolerance. Pt reports she has made the following changes including increasing vegetables, fruits, water and decreasing soda and juice intake. RD sent request for strips and lancets for QID blood sugar testing to be sent to Pt's preferred pharmacy to referring provider. Pt declined offer to schedule follow up nutrition visit today. All Pt's questions were answered during this encounter.  Physical Activity: walks at work Stress: 0 out of 10; self care includes: music, time alone Sleep: ok; average hours nightly 6-7 hours  24 hr Recall:  First Meal: skips most days; today banana ~8:05 am ( post prandial) Snack:  none Second meal:12 pm: low fat flavored yogurt, fruit Snack:  none Third meal: 7-8pm:  rice,beans, chicken, corn, cheese, diet coke Snack:  12 am: rice,beans, chicken, corn, cheese, diet coke Beverages:  water, diet soda  NUTRITION INTERVENTION  Nutrition education (E-1) on the following topics:   Initial Follow-up  [x]  []  Definition of Gestational Diabetes [x]  []  Why dietary management is important in  controlling blood glucose [x]  []  Effects each nutrient has on blood glucose levels [x]  []  Simple carbohydrates vs complex carbohydrates [x]  []  Fluid intake [x]  []  Creating a balanced meal plan [x]  []  Carbohydrate counting  [x]  []  When to check blood glucose levels [x]  []  Proper blood glucose monitoring techniques [x]  []  Effect of stress and stress reduction techniques  [x]  []  Exercise effect on blood glucose levels, appropriate exercise during pregnancy [x]  []  Importance of limiting caffeine and abstaining from alcohol and smoking [x]  []  Medications used for blood sugar control during pregnancy [x]  []  Hypoglycemia and rule of 15 [x]  []  Postpartum self care  Blood glucose monitor given: Accu Chek Guide me  Lot # [id number] Exp: 06/03/2024 CBG: 116 mg/dL, reported as ~ 1 hour post prandial per Pt    Patient instructed to monitor glucose levels: QID FBS: 60 - <= 95 mg/dL; 2 hour: <= 440 mg/dL  Patient received handouts: Nutrition Diabetes and Pregnancy Carbohydrate Counting List Blood glucose log Snack ideas for diabetes during pregnancy  Patient will be seen for follow-up as needed.

## 2023-12-23 ENCOUNTER — Ambulatory Visit

## 2023-12-23 ENCOUNTER — Encounter: Attending: Obstetrics and Gynecology | Admitting: Dietician

## 2023-12-23 ENCOUNTER — Other Ambulatory Visit: Payer: Self-pay

## 2023-12-23 ENCOUNTER — Encounter: Payer: Self-pay | Admitting: Obstetrics and Gynecology

## 2023-12-23 ENCOUNTER — Other Ambulatory Visit

## 2023-12-23 DIAGNOSIS — O24419 Gestational diabetes mellitus in pregnancy, unspecified control: Secondary | ICD-10-CM | POA: Insufficient documentation

## 2023-12-23 DIAGNOSIS — O365920 Maternal care for other known or suspected poor fetal growth, second trimester, not applicable or unspecified: Secondary | ICD-10-CM | POA: Insufficient documentation

## 2023-12-23 DIAGNOSIS — Z3A29 29 weeks gestation of pregnancy: Secondary | ICD-10-CM | POA: Insufficient documentation

## 2023-12-23 DIAGNOSIS — Z713 Dietary counseling and surveillance: Secondary | ICD-10-CM | POA: Diagnosis not present

## 2023-12-23 DIAGNOSIS — Z8632 Personal history of gestational diabetes: Secondary | ICD-10-CM | POA: Insufficient documentation

## 2023-12-23 DIAGNOSIS — O24410 Gestational diabetes mellitus in pregnancy, diet controlled: Secondary | ICD-10-CM | POA: Insufficient documentation

## 2023-12-23 MED ORDER — BLOOD GLUCOSE TEST VI STRP: 1.0000 | ORAL_STRIP | Freq: Four times a day (QID) | 5 refills | Status: AC

## 2023-12-23 MED ORDER — ACCU-CHEK SOFTCLIX LANCETS MISC: 5 refills | Status: AC

## 2023-12-23 NOTE — Addendum Note (Signed)
 Addended by: Loralyn Rochester on: 12/23/2023 10:09 AM   Modules accepted: Orders

## 2023-12-27 ENCOUNTER — Encounter: Payer: Self-pay | Admitting: Obstetrics and Gynecology

## 2023-12-27 ENCOUNTER — Other Ambulatory Visit: Payer: Self-pay

## 2023-12-27 ENCOUNTER — Ambulatory Visit: Admitting: Obstetrics and Gynecology

## 2023-12-27 VITALS — BP 113/71 | HR 90 | Wt 187.5 lb

## 2023-12-27 DIAGNOSIS — O0993 Supervision of high risk pregnancy, unspecified, third trimester: Secondary | ICD-10-CM

## 2023-12-27 DIAGNOSIS — Z3A30 30 weeks gestation of pregnancy: Secondary | ICD-10-CM | POA: Diagnosis not present

## 2023-12-27 DIAGNOSIS — O365920 Maternal care for other known or suspected poor fetal growth, second trimester, not applicable or unspecified: Secondary | ICD-10-CM

## 2023-12-27 DIAGNOSIS — O0990 Supervision of high risk pregnancy, unspecified, unspecified trimester: Secondary | ICD-10-CM | POA: Insufficient documentation

## 2023-12-27 DIAGNOSIS — O24419 Gestational diabetes mellitus in pregnancy, unspecified control: Secondary | ICD-10-CM | POA: Diagnosis not present

## 2023-12-27 DIAGNOSIS — O365930 Maternal care for other known or suspected poor fetal growth, third trimester, not applicable or unspecified: Secondary | ICD-10-CM

## 2023-12-27 MED ORDER — DEXCOM G7 SENSOR MISC: 1.0000 [IU] | 3 refills | Status: AC

## 2023-12-27 MED ORDER — BREAST PUMP MISC: 0 refills | Status: DC

## 2023-12-27 MED ORDER — BLOOD PRESSURE CUFF MISC: 1.0000 [IU] | 0 refills | Status: AC

## 2023-12-27 MED ORDER — BREAST PUMP MISC
0 refills | Status: AC
Start: 2023-12-27 — End: ?

## 2023-12-27 NOTE — Patient Instructions (Addendum)
 Normal Blood Pressure Top Number <140 Bottom number <90   Download Dexcom G7 app It will teach how to apply the sensor.  Apply sensor.  Connect to our account with code: [facility]abetes Target range should be 65-140.

## 2023-12-27 NOTE — Progress Notes (Signed)
 PRENATAL VISIT NOTE  Subjective:  Whitney Hickman is a 23 y.o. G3P1011 at 101w0d being seen today for ongoing prenatal care.  She is currently monitored for the following issues for this high-risk pregnancy and has History of ectopic pregnancy; Foster care (status); Poor fetal growth affecting management of mother in second trimester; Alpha thalassemia silent carrier; Sickle-cell trait (HCC); DM (diabetes mellitus), gestational; and Supervision of high risk pregnancy, antepartum on their problem list.  Patient reports no complaints.  Contractions: Not present. Vag. Bleeding: None.  Movement: Present. Denies leaking of fluid.   The following portions of the patient's history were reviewed and updated as appropriate: allergies, current medications, past family history, past medical history, past social history, past surgical history and problem list.   Objective:    Vitals:   12/27/23 1511  BP: 113/71  Pulse: 90  Weight: 187 lb 8 oz (85 kg)    Fetal Status:  Fetal Heart Rate (bpm): 140   Movement: Present    General: Alert, oriented and cooperative. Patient is in no acute distress.  Skin: Skin is warm and dry. No rash noted.   Cardiovascular: Normal heart rate noted  Respiratory: Normal respiratory effort, no problems with respiration noted  Abdomen: Soft, gravid, appropriate for gestational age.  Pain/Pressure: Absent     Pelvic: Cervical exam deferred        Extremities: Normal range of motion.     Mental Status: Normal mood and affect. Normal behavior. Normal judgment and thought content.   Assessment and Plan:  Pregnancy: G3P1011 at [redacted]w[redacted]d 1. Supervision of high risk pregnancy, antepartum (Primary) - Will send BP cuff for her to pharmacy - Records pulled over from [facility]. Devices (BREAST PUMP) MISC; Dispense one breast pump for patient  Dispense: 1 each; Refill: 0  2. Poor fetal growth affecting management of mother in second trimester, single or unspecified fetus 11%ile  at last US . Has f/u scheduled with MFM  3. Gestational diabetes mellitus (GDM), antepartum, gestational diabetes method of control unspecified - Reviewed diagnosis of GDM - Discussed the risks associated in pregnancy especially with poor control including but not limited to increased risk of preeclampsia, macrosomia, and stillbirth - Discussed diet and exercise modifications. Diabetes education referral completed. Reviewed the importance of health weight gain and reviewed IOM recommendations.  - We discussed the possibility of management of the pregnancy with medications I.e. Metformin or Insulin. Discussed if medication initiated, that monitoring during the pregnancy will be started at 32 wks or at the time of medication initiation. Serial US  for growth would be started as well. Discussed that the timing of delivery would then be in the 39th week - Counseled on importance of postpartum follow up testing to ensure resolution and ensure patient does not have ongoing DM - She has not been able to check CBGs regularly partly due to work - works in [employer] and so has trouble checking. When she has checked she reports her meals were normal (I.e. 1 hr result was 131). She is following the diet carefully.  - She would be very interested in CGM testing. Dexcom7 sent in. Will send message to [facility] to follow as well. Reviewed office will do prior authorization once available.   4. Pregnancy with 30 completed weeks gestation   Preterm labor symptoms and general obstetric precautions including but not limited to vaginal bleeding, contractions, leaking of fluid and fetal movement were reviewed in detail with the patient. Please refer to After Visit Summary for  other counseling recommendations.   Return in about 2 weeks (around 01/10/2024) for HROB VISIT.  Future Appointments  Date Time Provider Department Center  01/11/2024  8:00 AM [facility] PROVIDER 1 [facility]  01/11/2024  8:30 AM [facility] US4  [facility]  01/11/2024  9:55 AM Felipe Horton, [place] , CNM [facility]    Lacey Pian, MD

## 2023-12-28 ENCOUNTER — Encounter: Admitting: Obstetrics & Gynecology

## 2023-12-29 ENCOUNTER — Encounter: Payer: Self-pay | Admitting: *Deleted

## 2023-12-29 DIAGNOSIS — D563 Thalassemia minor: Secondary | ICD-10-CM

## 2023-12-29 DIAGNOSIS — O0990 Supervision of high risk pregnancy, unspecified, unspecified trimester: Secondary | ICD-10-CM

## 2023-12-29 DIAGNOSIS — D573 Sickle-cell trait: Secondary | ICD-10-CM

## 2023-12-30 ENCOUNTER — Telehealth: Payer: Self-pay | Admitting: Lactation Services

## 2023-12-30 NOTE — Telephone Encounter (Signed)
 Whitney Hickman (Key: [id number]) PA Case ID #: [id number] Rx #: [id number] Need Help? Call us  at [phone] Outcome Approved today by [facility] 2017 NCPDP Request Reference Number: [id number]. DEXCOM G7 MIS SENSOR is approved through 06/30/2024. For further questions, call [employer] at [phone]. Effective Date: 12/30/2023 Authorization Expiration Date: 06/30/2024 Drug Dexcom G7 Sensor ePA cloud logo Form [employer] Electronic Prior Authorization Form [phone] NCPDP) Original Claim Info 71  Called Pharmacy to let them know that PA for Dexcom G7 approved. Was not able to reach the  pharmacy after holding for over 15 minutes, will try again tomorrow.

## 2023-12-30 NOTE — Telephone Encounter (Signed)
 Awaiting determination  Raenell Bump (Key: [id number]) PA Case ID #: [id number] Rx #: [id number] Need Help? Call us  at [phone] Status sent iconSent to Plan today Drug Dexcom G7 Sensor ePA cloud logo Form [employer] Electronic Prior Authorization Form [phone] NCPDP) Original Claim Info 44

## 2024-01-03 NOTE — Telephone Encounter (Signed)
 Returned call to [employer] and spoke with Metz. Informed her Dexcom has been approved, she has already picked up.

## 2024-01-11 ENCOUNTER — Other Ambulatory Visit: Payer: Self-pay | Admitting: Obstetrics

## 2024-01-11 ENCOUNTER — Other Ambulatory Visit: Payer: Self-pay | Admitting: *Deleted

## 2024-01-11 ENCOUNTER — Ambulatory Visit: Attending: Obstetrics

## 2024-01-11 ENCOUNTER — Ambulatory Visit: Admitting: Advanced Practice Midwife

## 2024-01-11 ENCOUNTER — Ambulatory Visit: Admitting: Obstetrics and Gynecology

## 2024-01-11 VITALS — BP 118/66

## 2024-01-11 VITALS — BP 109/55 | HR 74 | Wt 188.0 lb

## 2024-01-11 DIAGNOSIS — D563 Thalassemia minor: Secondary | ICD-10-CM

## 2024-01-11 DIAGNOSIS — O0990 Supervision of high risk pregnancy, unspecified, unspecified trimester: Secondary | ICD-10-CM

## 2024-01-11 DIAGNOSIS — Z3A32 32 weeks gestation of pregnancy: Secondary | ICD-10-CM | POA: Insufficient documentation

## 2024-01-11 DIAGNOSIS — O99113 Other diseases of the blood and blood-forming organs and certain disorders involving the immune mechanism complicating pregnancy, third trimester: Secondary | ICD-10-CM | POA: Diagnosis not present

## 2024-01-11 DIAGNOSIS — O365920 Maternal care for other known or suspected poor fetal growth, second trimester, not applicable or unspecified: Secondary | ICD-10-CM | POA: Diagnosis present

## 2024-01-11 DIAGNOSIS — O24419 Gestational diabetes mellitus in pregnancy, unspecified control: Secondary | ICD-10-CM | POA: Diagnosis present

## 2024-01-11 DIAGNOSIS — O24410 Gestational diabetes mellitus in pregnancy, diet controlled: Secondary | ICD-10-CM

## 2024-01-11 DIAGNOSIS — O365930 Maternal care for other known or suspected poor fetal growth, third trimester, not applicable or unspecified: Secondary | ICD-10-CM | POA: Diagnosis present

## 2024-01-11 DIAGNOSIS — D573 Sickle-cell trait: Secondary | ICD-10-CM

## 2024-01-11 NOTE — Progress Notes (Signed)
 Maternal-Fetal Medicine Consultation Name: Whitney Hickman MRN: [id number]  G2 P1011 at 32w 1d gestation.  Patient is here for fetal growth assessment. She has gestational diabetes and has Dexcom to monitor blood glucose.  She reports her blood glucose levels are within normal range and that diabetes is well-controlled on diet.  Ultrasound On today's ultrasound, the estimated fetal weight is at the 16th percentile and the abdominal circumference measurement at the 8th percentile.  Amniotic fluid is normal good fetal activity seen.  Umbilical artery Doppler showed normal forward diastolic flow. Antenatal testing is reassuring.  BPP 8/8.  Fetal growth restriction I explained the finding of fetal growth restriction that is difficult to differentiate from a constitutionally small for gestational age fetus. I discussed the possible causes of fetal growth restriction including placental insufficiency (most common cause.  I discussed ultrasound protocol of monitoring fetal growth restriction.  Timing of delivery: Since small for gestational age fetuses have a higher chance of having perinatal mortality and morbidity, delivery at 19 to [redacted] weeks gestation is reasonable.  If, however, severe fetal growth restriction is seen with normal antenatal testing, we will recommend delivery at [redacted] weeks gestation.   Recommendations - NST next week. - BPP and Doppler in 2 weeks and fetal growth assessment in 3 weeks.  Consultation including face-to-face (more than 50%) counseling 20 minutes.

## 2024-01-11 NOTE — Progress Notes (Unsigned)
   PRENATAL VISIT NOTE  Subjective:  Whitney Hickman is a 23 y.o. G3P1011 at [redacted]w[redacted]d being seen today for ongoing prenatal care.  She is currently monitored for the following issues for this {Blank single:19197::high-risk,low-risk} pregnancy and has History of ectopic pregnancy; Foster care (status); Poor fetal growth affecting management of mother in second trimester; Alpha thalassemia silent carrier; Sickle-cell trait (HCC); DM (diabetes mellitus), gestational; and Supervision of high risk pregnancy, antepartum on their problem list.   Patient reports {sx:[id number]}.  Contractions: Not present. Vag. Bleeding: None.  Movement: Present. Denies leaking of fluid.   The following portions of the patient's history were reviewed and updated as appropriate: allergies, current medications, past family history, past medical history, past social history, past surgical history and problem list.   Objective:   Vitals:   01/11/24 1017  BP: (!) 109/55  Pulse: 74  Weight: 188 lb (85.3 kg)    Fetal Status: Fetal Heart Rate (bpm): 144   Movement: Present     General:  Alert, oriented and cooperative. Patient is in no acute distress.  Skin: Skin is warm and dry. No rash noted.   Cardiovascular: Normal heart rate noted  Respiratory: Normal respiratory effort, no problems with respiration noted  Abdomen: Soft, gravid, appropriate for gestational age.  Pain/Pressure: Absent     Pelvic: {Blank single:19197::Cervical exam performed in the presence of a chaperone,Cervical exam deferred}        Extremities: Normal range of motion.  Edema: None  Mental Status: Normal mood and affect. Normal behavior. Normal judgment and thought content.   Assessment and Plan:  Pregnancy: G3P1011 at [redacted]w[redacted]d There are no diagnoses linked to this encounter. {Blank single:19197::Term,Preterm} labor symptoms and general obstetric precautions including but not limited to vaginal bleeding, contractions, leaking of fluid and fetal  movement were reviewed in detail with the patient. Please refer to After Visit Summary for other counseling recommendations.   No follow-ups on file.  Future Appointments  Date Time Provider Department Center  01/19/2024  9:30 AM [facility] NURSE [facility]  01/19/2024  9:45 AM [facility] NST [facility]    Bettey Muraoka  Felipe Horton, CNM [facility] for [employer]

## 2024-01-19 ENCOUNTER — Ambulatory Visit: Admitting: *Deleted

## 2024-01-19 ENCOUNTER — Other Ambulatory Visit: Payer: Self-pay | Admitting: *Deleted

## 2024-01-19 VITALS — BP 115/60 | HR 80

## 2024-01-19 DIAGNOSIS — D563 Thalassemia minor: Secondary | ICD-10-CM

## 2024-01-19 DIAGNOSIS — O24410 Gestational diabetes mellitus in pregnancy, diet controlled: Secondary | ICD-10-CM | POA: Diagnosis not present

## 2024-01-19 DIAGNOSIS — O365930 Maternal care for other known or suspected poor fetal growth, third trimester, not applicable or unspecified: Secondary | ICD-10-CM

## 2024-01-19 DIAGNOSIS — E669 Obesity, unspecified: Secondary | ICD-10-CM | POA: Diagnosis not present

## 2024-01-19 DIAGNOSIS — Z3A33 33 weeks gestation of pregnancy: Secondary | ICD-10-CM

## 2024-01-19 DIAGNOSIS — O99213 Obesity complicating pregnancy, third trimester: Secondary | ICD-10-CM | POA: Insufficient documentation

## 2024-01-19 DIAGNOSIS — D573 Sickle-cell trait: Secondary | ICD-10-CM

## 2024-01-19 DIAGNOSIS — O0990 Supervision of high risk pregnancy, unspecified, unspecified trimester: Secondary | ICD-10-CM

## 2024-01-19 DIAGNOSIS — O24419 Gestational diabetes mellitus in pregnancy, unspecified control: Secondary | ICD-10-CM

## 2024-01-19 DIAGNOSIS — O365920 Maternal care for other known or suspected poor fetal growth, second trimester, not applicable or unspecified: Secondary | ICD-10-CM

## 2024-01-19 NOTE — Procedures (Addendum)
 Whitney Hickman 2001-02-10 [redacted]w[redacted]d  Fetus A Non-Stress Test Interpretation for 01/19/24  Indication: GDM diet controlled, obese  Fetal Heart Rate A Mode: External Baseline Rate (A): 140 bpm Variability: Moderate Accelerations: 15 x 15 Decelerations: None Multiple birth?: No  Uterine Activity Mode: Toco Contraction Frequency (min): none Resting Tone Palpated: Relaxed  Interpretation (Fetal Testing) Nonstress Test Interpretation: Reactive Comments: traicng reviewed by Dr. Ileana  This patient has been followed due to IUGR and diet-controlled gestational diabetes.    A bedside ultrasound performed today shows that the fetus is in the vertex presentation.    There was normal amniotic fluid noted with a total AFI of 9.17 cm.    Doppler studies of the umbilical arteries performed today showed a normal S/D ratio of 2.60.  There were no signs of absent or reversed end-diastolic flow.  She will return in 1 week for another BPP and umbilical artery Doppler study.

## 2024-01-25 ENCOUNTER — Telehealth: Admitting: Obstetrics and Gynecology

## 2024-01-25 DIAGNOSIS — O24410 Gestational diabetes mellitus in pregnancy, diet controlled: Secondary | ICD-10-CM | POA: Diagnosis not present

## 2024-01-25 DIAGNOSIS — Z3A34 34 weeks gestation of pregnancy: Secondary | ICD-10-CM | POA: Diagnosis not present

## 2024-01-25 NOTE — Progress Notes (Signed)
 TELEHEALTH OBSTETRICS VISIT ENCOUNTER NOTE  FOR DIABETES MANAGEMENT DURING PREGNANCY    Provider location: Center for [facility]   Patient location: Home  I connected withNAME@ on 01/25/24 at  3:10 PM EDT by telephone at home and verified that I am speaking with the correct person using two identifiers. Of note, unable to do video encounter due to technical difficulties.    I discussed the limitations, risks, security and privacy concerns of performing an evaluation and management service by telephone and the availability of in person appointments. I also discussed with the patient that there may be a patient responsible charge related to this service. The patient expressed understanding and agreed to proceed.   History:   Whitney Hickman is a 23 y.o. G3P1011 at [redacted]w[redacted]d by LMP being seen today for diabetes management during pregnancy. Her obstetrical history is significant for GDM and SGA.   Patient reports no complaints. Reports fetal movement. Denies any contractions, bleeding or leaking of fluid.   The following portions of the patient's history were reviewed and updated as appropriate: allergies, current medications, past family history, past medical history, past social history, past surgical history and problem list.   Past Medical History:  Diagnosis Date   Asthma    PID (acute pelvic inflammatory disease)    Past Surgical History:  Procedure Laterality Date   DIAGNOSTIC LAPAROSCOPY WITH REMOVAL OF ECTOPIC PREGNANCY Right 06/12/2021   Procedure: EXPLORATORY  LAPAROSCOPY WITH REMOVAL OF ECTOPIC PREGNANCY;  Surgeon: Izell Harari, MD;  Location: [facility] OR;  Service: Gynecology;  Laterality: Right;   NO PAST SURGERIES     Family History  Problem Relation Age of Onset   Diabetes Neg Hx    Hypertension Neg Hx    Social History   Tobacco Use   Smoking status: Former    Types: Cigars   Smokeless tobacco: Never  Vaping Use   Vaping status: Never Used   Substance Use Topics   Alcohol use: Not Currently   Drug use: Not Currently    Types: Marijuana   No Known Allergies Current Outpatient Medications on File Prior to Visit  Medication Sig Dispense Refill   Accu-Chek Softclix Lancets lancets Use four times daily to check blood sugar fasting and 2 hours after meals or as needed 200 each 5   aspirin EC 81 MG tablet Take 81 mg by mouth daily. Swallow whole.     Blood Pressure Monitoring (BLOOD PRESSURE CUFF) MISC 1 Units by Does not apply route once a week. 1 each 0   Continuous Glucose Sensor (DEXCOM G7 SENSOR) MISC 1 Units by Does not apply route as directed. Change sensor every 10 days 3 each 3   FEROSUL 325 (65 Fe) MG tablet Take 325 mg by mouth every other day.     Glucose Blood (BLOOD GLUCOSE TEST STRIPS) STRP 1 each by In Vitro route in the morning, at noon, in the evening, and at bedtime. Accu-check brand or manufacturer covered by [employer]. Use to check blood sugar fasting and 2 hours after meals or as needed 200 strip 5   Misc. Devices (BREAST PUMP) MISC Dispense one breast pump for patient 1 each 0   Prenatal Vit-Fe Fumarate-FA (PRENATAL MULTIVITAMIN) TABS tablet Take 1 tablet by mouth daily at 12 noon.     No current facility-administered medications on file prior to visit.      Objective:   General:  Alert, oriented and cooperative.   Mental Status: Normal mood and  affect perceived. Normal judgment and thought content.   The Rest of physical exam deferred due to type of encounter   Hospital admissions related to diabetes: None   GDM diagnosed off of 1 hour 204  No family history of DM    Viability scan normal.   Anatomy scan SGA. 2400 grams @ 21%tile, normal AFI   Eye exam: NA   Most recent HgA1c: hgbA1c 5.7%  Fetal Echo NA   PCP or endocrinologist. None    Blood sugar monitoring   -Has CGM but does not feel it is accurate  -With CGM, target Blood sugar set at 65-140.   Currently glucose in  target range is 89% in target range. Target goals set at 65-140.    Has back up glucose monitor     Fasting BS: Fasting this morning 91 but limited data available for review other than Freestyle data. Reports 2 hour PP meals <120   Current Diabetes medications:   Diet control.   Plan:   -Continue Diet control    -With Freestyle CGM,  BS ranges should be set to 65-140 with a goal of 70% of BS within target range. She is currently at target 89% in range.    -Check BS using fingersticks over the next few days to compare with CGM.   -Discussed only 30-40 grams of carbohydrates with each meal, with majority of your meals being protein and high fiber vegetables.    -Start a fiber supplement everyday.    -Protein snack before bed. Recommend 10-20 grams of protein before bed.    -Anticipate weekly diabetes visits until BS's are better controlled -Anticipate frequent in person OB visits along with MFM care.  -q4 week growth US  starting at 20w with antenatal weekly testing starting at 32 weeks or sooner if needed.    -Continue BASA 81 mg      This visit is for the purposes of diabetes management only. Please keep scheduled OB visits with OB team for prenatal care.    Preterm labor symptoms and general obstetric precautions including but not limited to vaginal bleeding, contractions, leaking of fluid and fetal movement were reviewed in detail with the patient.  I discussed the assessment and treatment plan with the patient. The patient was provided an opportunity to ask questions and all were answered. The patient agreed with the plan and demonstrated an understanding of the instructions. The patient was advised to call back or seek an in-person office evaluation/go to MAU at [facility] for any urgent or concerning symptoms. Please refer to After Visit Summary for other counseling recommendations.    I provided 15 minutes of non-face-to-face time during this  encounter.   Chastin Riesgo, Delon FERNS, NP Faculty Practice Center for [employer], [facility]

## 2024-01-26 ENCOUNTER — Ambulatory Visit: Admitting: Obstetrics and Gynecology

## 2024-01-26 ENCOUNTER — Other Ambulatory Visit: Payer: Self-pay

## 2024-01-26 VITALS — BP 113/73 | HR 67 | Wt 185.9 lb

## 2024-01-26 DIAGNOSIS — O365920 Maternal care for other known or suspected poor fetal growth, second trimester, not applicable or unspecified: Secondary | ICD-10-CM | POA: Diagnosis not present

## 2024-01-26 DIAGNOSIS — O24410 Gestational diabetes mellitus in pregnancy, diet controlled: Secondary | ICD-10-CM | POA: Diagnosis not present

## 2024-01-26 DIAGNOSIS — D563 Thalassemia minor: Secondary | ICD-10-CM

## 2024-01-26 DIAGNOSIS — O0990 Supervision of high risk pregnancy, unspecified, unspecified trimester: Secondary | ICD-10-CM

## 2024-01-26 DIAGNOSIS — Z3A34 34 weeks gestation of pregnancy: Secondary | ICD-10-CM

## 2024-01-26 DIAGNOSIS — O99113 Other diseases of the blood and blood-forming organs and certain disorders involving the immune mechanism complicating pregnancy, third trimester: Secondary | ICD-10-CM | POA: Diagnosis not present

## 2024-01-26 NOTE — Progress Notes (Signed)
 Does not log glucose numbers

## 2024-01-26 NOTE — Progress Notes (Signed)
   PRENATAL VISIT NOTE  Subjective:  Whitney Hickman is a 23 y.o. G3P1011 at [redacted]w[redacted]d being seen today for ongoing prenatal care.  She is currently monitored for the following issues for this high-risk pregnancy and has History of ectopic pregnancy; Foster care (status); Poor fetal growth affecting management of mother in second trimester; Alpha thalassemia silent carrier; Sickle-cell trait (HCC); DM (diabetes mellitus), gestational, diet-controlled; and Supervision of high risk pregnancy, antepartum on their problem list.  Patient doing well with no acute concerns today. She reports pain underneath toes of the left foot..   [name]. Bleeding: None.  Movement: Present. Denies leaking of fluid.   The following portions of the patient's history were reviewed and updated as appropriate: allergies, current medications, past family history, past medical history, past social history, past surgical history and problem list. Problem list updated.  Objective:   Vitals:   01/26/24 1530  BP: 113/73  Pulse: 67  Weight: 185 lb 14.4 oz (84.3 kg)    Fetal Status: Fetal Heart Rate (bpm): 139 Fundal Height: 34 cm Movement: Present     General:  Alert, oriented and cooperative. Patient is in no acute distress.  Skin: Skin is warm and dry. No rash noted.   Cardiovascular: Normal heart rate noted  Respiratory: Normal respiratory effort, no problems with respiration noted  Abdomen: Soft, gravid, appropriate for gestational age.  Pain/Pressure: Absent     Pelvic: Cervical exam deferred        Extremities: Normal range of motion.  Edema: None  Mental Status:  Normal mood and affect. Normal behavior. Normal judgment and thought content.   Assessment and Plan:  Pregnancy: G3P1011 at [redacted]w[redacted]d  1. [redacted] weeks gestation of pregnancy (Primary)   2. Diet controlled gestational diabetes mellitus (GDM) in third trimester Pt did not record blood sugars other than sporadic numbers from dexcom.  Again emphasized need for  recording blood sugars to optimize control.  3. Supervision of high risk pregnancy, antepartum Continue routine prenatal care  4. Poor fetal growth affecting management of mother in second trimester, single or unspecified fetus Pt has follow up scan on 02/03/24 for eval  5. Alpha thalassemia silent carrier   Preterm labor symptoms and general obstetric precautions including but not limited to vaginal bleeding, contractions, leaking of fluid and fetal movement were reviewed in detail with the patient.  Please refer to After Visit Summary for other counseling recommendations.   Return in about 2 weeks (around 02/09/2024) for [facility], in person, 36 weeks swabs.   Jerilynn Buddle, MD Faculty Attending Center for [facility]

## 2024-02-03 ENCOUNTER — Ambulatory Visit: Attending: Obstetrics

## 2024-02-03 ENCOUNTER — Other Ambulatory Visit: Payer: Self-pay | Admitting: *Deleted

## 2024-02-03 ENCOUNTER — Ambulatory Visit: Admitting: Obstetrics

## 2024-02-03 VITALS — BP 116/46 | HR 75

## 2024-02-03 DIAGNOSIS — D563 Thalassemia minor: Secondary | ICD-10-CM | POA: Insufficient documentation

## 2024-02-03 DIAGNOSIS — O365920 Maternal care for other known or suspected poor fetal growth, second trimester, not applicable or unspecified: Secondary | ICD-10-CM | POA: Diagnosis present

## 2024-02-03 DIAGNOSIS — Z148 Genetic carrier of other disease: Secondary | ICD-10-CM | POA: Diagnosis not present

## 2024-02-03 DIAGNOSIS — O24410 Gestational diabetes mellitus in pregnancy, diet controlled: Secondary | ICD-10-CM

## 2024-02-03 DIAGNOSIS — Z3A35 35 weeks gestation of pregnancy: Secondary | ICD-10-CM | POA: Diagnosis present

## 2024-02-03 DIAGNOSIS — O0993 Supervision of high risk pregnancy, unspecified, third trimester: Secondary | ICD-10-CM

## 2024-02-03 DIAGNOSIS — O365930 Maternal care for other known or suspected poor fetal growth, third trimester, not applicable or unspecified: Secondary | ICD-10-CM | POA: Diagnosis present

## 2024-02-03 DIAGNOSIS — O0990 Supervision of high risk pregnancy, unspecified, unspecified trimester: Secondary | ICD-10-CM | POA: Diagnosis present

## 2024-02-03 DIAGNOSIS — D573 Sickle-cell trait: Secondary | ICD-10-CM

## 2024-02-03 NOTE — Progress Notes (Signed)
 MFM Consult Note  Whitney Hickman is currently at 35 weeks and 3 days.  She has been followed due to a small for gestational age fetus and diet-controlled gestational diabetes.  She reports that her fingerstick values have mostly been within normal limits.  On today's exam, the overall EFW of 5 pounds 5 ounces measures at the 21st percentile for her gestational age.    Low normal amniotic fluid with a total AFI of 7.72 cm continues to be noted.  A BPP performed today was 8 out of 8.  Due to gestational diabetes, delivery is recommended at around 39 weeks.    We will continue to follow her with weekly fetal testing until delivery.    She will return in 1 week for another BPP.  The patient stated that all of her questions were answered today.  A total of 20 minutes was spent counseling and coordinating the care for this patient.  Greater than 50% of the time was spent in direct face-to-face contact.

## 2024-02-10 ENCOUNTER — Ambulatory Visit: Attending: Obstetrics

## 2024-02-10 ENCOUNTER — Other Ambulatory Visit: Payer: Self-pay | Admitting: Obstetrics

## 2024-02-10 ENCOUNTER — Ambulatory Visit (HOSPITAL_BASED_OUTPATIENT_CLINIC_OR_DEPARTMENT_OTHER): Admitting: Maternal & Fetal Medicine

## 2024-02-10 VITALS — BP 128/54 | HR 70

## 2024-02-10 DIAGNOSIS — O43123 Velamentous insertion of umbilical cord, third trimester: Secondary | ICD-10-CM | POA: Diagnosis not present

## 2024-02-10 DIAGNOSIS — O365930 Maternal care for other known or suspected poor fetal growth, third trimester, not applicable or unspecified: Secondary | ICD-10-CM | POA: Insufficient documentation

## 2024-02-10 DIAGNOSIS — O0990 Supervision of high risk pregnancy, unspecified, unspecified trimester: Secondary | ICD-10-CM

## 2024-02-10 DIAGNOSIS — O365920 Maternal care for other known or suspected poor fetal growth, second trimester, not applicable or unspecified: Secondary | ICD-10-CM | POA: Diagnosis present

## 2024-02-10 DIAGNOSIS — O24410 Gestational diabetes mellitus in pregnancy, diet controlled: Secondary | ICD-10-CM

## 2024-02-10 DIAGNOSIS — D573 Sickle-cell trait: Secondary | ICD-10-CM

## 2024-02-10 DIAGNOSIS — Z3A36 36 weeks gestation of pregnancy: Secondary | ICD-10-CM | POA: Insufficient documentation

## 2024-02-10 DIAGNOSIS — D563 Thalassemia minor: Secondary | ICD-10-CM | POA: Diagnosis present

## 2024-02-10 DIAGNOSIS — O99013 Anemia complicating pregnancy, third trimester: Secondary | ICD-10-CM

## 2024-02-10 NOTE — Progress Notes (Signed)
   Patient information  Patient Name: Whitney Hickman  Patient MRN:   [id number]  Referring practice: MFM Referring Provider: [facility])  Problem List   Patient Active Problem List   Diagnosis Date Noted   Supervision of high risk pregnancy, antepartum 12/27/2023   DM (diabetes mellitus), gestational, diet-controlled 12/23/2023   Alpha thalassemia silent carrier 12/15/2023   Sickle-cell trait (HCC) 12/15/2023   Poor fetal growth affecting management of mother in second trimester 11/22/2023   History of ectopic pregnancy 07/15/2021   Foster care (status) 06/02/2018    Maternal Fetal medicine Consult  Whitney Hickman is a 23 y.o. G3P1011 at [redacted]w[redacted]d here for ultrasound and consultation. Whitney Hickman is doing well today with no acute concerns. Today we focused on the following:   History of FGR: Most recent growth was at the 21st percentile with the abdominal circumference at the 15th percentile.  The estimated fetal weight at the anatomy scan was in the 15th percentile.  Since the growth is now stable she does not need to continue to have Doppler studies.  Continue weekly testing due to velamentous cord insertion.  GDMA1: Well controlled with diet. Deliver by EDD.  The patient had time to ask questions that were answered to her satisfaction.  She verbalized understanding and agrees to proceed with the plan below.  Sonographic findings Single intrauterine pregnancy. Fetal cardiac activity: Observed. Presentation: Cephalic. Interval fetal anatomy appears normal. Amniotic fluid: Within normal limits.  MVP: 6.05 cm. Placenta: Anterior. BPP: 8/8.   There are limitations of prenatal ultrasound such as the inability to detect certain abnormalities due to poor visualization. Various factors such as fetal position, gestational age and maternal body habitus may increase the difficulty in visualizing the fetal anatomy.    Recommendations - Weekly NSTs with delivery  around 39-40 weeks as long as antenatal testing remains reassuring.  - Continue diabetic care with OB provider.   Review of Systems: A review of systems was performed and was negative except per HPI   Vitals and Physical Exam    02/10/2024   11:08 AM 02/03/2024    9:09 AM 01/26/2024    3:30 PM  Vitals with BMI  Weight   185 lbs 14 oz  Systolic 128 116 886  Diastolic 54 46 73  Pulse 70 75 67    Sitting comfortably on the sonogram table Nonlabored breathing Normal rate and rhythm Abdomen is nontender  Past pregnancies OB History  Gravida Para Term Preterm AB Living  3 1 1  0 1 1  SAB IAB Ectopic Multiple Live Births  0 0 1  1    # Outcome Date GA Lbr Len/2nd Weight Sex Type Anes PTL Lv  3 Current           2 Ectopic 05/2021          1 Term 11/25/16 [redacted]w[redacted]d 05:47 / 00:50 6 lb 0.7 oz (2.741 kg) F Vag-Spont EPI  LIV     I spent 20 minutes reviewing the patients chart, including labs and images as well as counseling the patient about her medical conditions. Greater than 50% of the time was spent in direct face-to-face patient counseling.  Delora Smaller  MFM, [place]   02/10/2024  12:00 PM

## 2024-02-17 ENCOUNTER — Other Ambulatory Visit (HOSPITAL_COMMUNITY)
Admission: RE | Admit: 2024-02-17 | Discharge: 2024-02-17 | Disposition: A | Discharge status: Home / Self Care | Source: Ambulatory Visit | Attending: Obstetrics and Gynecology | Admitting: Obstetrics and Gynecology

## 2024-02-17 ENCOUNTER — Other Ambulatory Visit

## 2024-02-17 ENCOUNTER — Other Ambulatory Visit: Payer: Self-pay

## 2024-02-17 ENCOUNTER — Ambulatory Visit: Admitting: Obstetrics and Gynecology

## 2024-02-17 ENCOUNTER — Ambulatory Visit

## 2024-02-17 VITALS — BP 120/52 | HR 69 | Wt 188.0 lb

## 2024-02-17 VITALS — BP 115/58

## 2024-02-17 DIAGNOSIS — O0990 Supervision of high risk pregnancy, unspecified, unspecified trimester: Secondary | ICD-10-CM

## 2024-02-17 DIAGNOSIS — D563 Thalassemia minor: Secondary | ICD-10-CM

## 2024-02-17 DIAGNOSIS — O365920 Maternal care for other known or suspected poor fetal growth, second trimester, not applicable or unspecified: Secondary | ICD-10-CM | POA: Insufficient documentation

## 2024-02-17 DIAGNOSIS — O24410 Gestational diabetes mellitus in pregnancy, diet controlled: Secondary | ICD-10-CM

## 2024-02-17 DIAGNOSIS — Z3A37 37 weeks gestation of pregnancy: Secondary | ICD-10-CM | POA: Diagnosis not present

## 2024-02-17 DIAGNOSIS — O0993 Supervision of high risk pregnancy, unspecified, third trimester: Secondary | ICD-10-CM

## 2024-02-17 DIAGNOSIS — O24419 Gestational diabetes mellitus in pregnancy, unspecified control: Secondary | ICD-10-CM

## 2024-02-17 DIAGNOSIS — Z3490 Encounter for supervision of normal pregnancy, unspecified, unspecified trimester: Secondary | ICD-10-CM

## 2024-02-17 DIAGNOSIS — D573 Sickle-cell trait: Secondary | ICD-10-CM

## 2024-02-17 NOTE — Progress Notes (Signed)
   PRENATAL VISIT NOTE  Subjective:  Whitney Hickman is a 23 y.o. G3P1011 at [redacted]w[redacted]d being seen today for ongoing prenatal care.  She is currently monitored for the following issues for this high-risk pregnancy and has History of ectopic pregnancy; Foster care (status); Poor fetal growth affecting management of mother in second trimester (RESOLVED); Alpha thalassemia silent carrier; Sickle-cell trait (HCC); DM (diabetes mellitus), gestational, diet-controlled; and Supervision of high risk pregnancy, antepartum on their problem list.  Patient doing well with no acute concerns today. She reports light vaginal spotting now with brown discharge.   . Vag. Bleeding: Scant.  Movement: Present. Denies leaking of fluid.   The following portions of the patient's history were reviewed and updated as appropriate: allergies, current medications, past family history, past medical history, past social history, past surgical history and problem list. Problem list updated.  Objective:   Vitals:   02/17/24 1533  BP: (!) 120/52  Pulse: 69  Weight: 188 lb (85.3 kg)    Fetal Status: Fetal Heart Rate (bpm): 159 Fundal Height: 37 cm Movement: Present     General:  Alert, oriented and cooperative. Patient is in no acute distress.  Skin: Skin is warm and dry. No rash noted.   Cardiovascular: Normal heart rate noted  Respiratory: Normal respiratory effort, no problems with respiration noted  Abdomen: Soft, gravid, appropriate for gestational age.  Pain/Pressure: Present     Pelvic: Cervical exam performed Dilation: Fingertip Effacement (%): 40 Station: -3  Extremities: Normal range of motion.  Edema: Trace  Mental Status:  Normal mood and affect. Normal behavior. Normal judgment and thought content.  SSE:  no active bleeding, moderate old blood seen in the vault Assessment and Plan:  Pregnancy: G3P1011 at [redacted]w[redacted]d  1. [redacted] weeks gestation of pregnancy (Primary)   2. Diet controlled gestational diabetes mellitus  (GDM) in third trimester FBS: 83-96 PPBS: 82-176, most in range Will schedule for IOL at 39 weeks per MFM  3. Supervision of high risk pregnancy, antepartum Continue routine prenatal care  4. Alpha thalassemia silent carrier   Term labor symptoms and general obstetric precautions including but not limited to vaginal bleeding, contractions, leaking of fluid and fetal movement were reviewed in detail with the patient.  Please refer to After Visit Summary for other counseling recommendations.   Return in about 1 week (around 02/24/2024) for [facility], in person.   Jerilynn Buddle, MD Faculty Attending Center for [facility]

## 2024-02-17 NOTE — Procedures (Signed)
 [name] Jul 29, 2000 [redacted]w[redacted]d  Fetus A Non-Stress Test Interpretation for 02/17/24  Indication: Gestational diabetes  Fetal Heart Rate A Mode: External Baseline Rate (A): 130 bpm Variability: Moderate Accelerations: 15 x 15 Decelerations: None  Uterine Activity Mode: Palpation, Toco Contraction Frequency (min): none noted Resting Tone Palpated: Relaxed  Interpretation (Fetal Testing) Nonstress Test Interpretation: Reactive Comments: Reviewed with Dr. William

## 2024-02-18 ENCOUNTER — Encounter (HOSPITAL_COMMUNITY): Payer: Self-pay | Admitting: Anesthesiology

## 2024-02-18 ENCOUNTER — Inpatient Hospital Stay (HOSPITAL_COMMUNITY)
Admission: AD | Admit: 2024-02-18 | Discharge: 2024-02-20 | DRG: 807 | Disposition: A | Discharge status: Home / Self Care | Attending: Family Medicine | Admitting: Family Medicine

## 2024-02-18 ENCOUNTER — Encounter (HOSPITAL_COMMUNITY): Payer: Self-pay | Admitting: Obstetrics & Gynecology

## 2024-02-18 ENCOUNTER — Inpatient Hospital Stay (HOSPITAL_COMMUNITY): Admitting: Anesthesiology

## 2024-02-18 ENCOUNTER — Other Ambulatory Visit: Payer: Self-pay

## 2024-02-18 DIAGNOSIS — O24420 Gestational diabetes mellitus in childbirth, diet controlled: Principal | ICD-10-CM | POA: Diagnosis present

## 2024-02-18 DIAGNOSIS — O99820 Streptococcus B carrier state complicating pregnancy: Secondary | ICD-10-CM | POA: Diagnosis not present

## 2024-02-18 DIAGNOSIS — Z148 Genetic carrier of other disease: Secondary | ICD-10-CM | POA: Diagnosis not present

## 2024-02-18 DIAGNOSIS — O99824 Streptococcus B carrier state complicating childbirth: Secondary | ICD-10-CM | POA: Diagnosis present

## 2024-02-18 DIAGNOSIS — Z87891 Personal history of nicotine dependence: Secondary | ICD-10-CM

## 2024-02-18 DIAGNOSIS — Z3A37 37 weeks gestation of pregnancy: Secondary | ICD-10-CM

## 2024-02-18 DIAGNOSIS — O24410 Gestational diabetes mellitus in pregnancy, diet controlled: Principal | ICD-10-CM | POA: Diagnosis present

## 2024-02-18 DIAGNOSIS — Z3490 Encounter for supervision of normal pregnancy, unspecified, unspecified trimester: Secondary | ICD-10-CM

## 2024-02-18 DIAGNOSIS — D573 Sickle-cell trait: Secondary | ICD-10-CM | POA: Diagnosis present

## 2024-02-18 DIAGNOSIS — O26893 Other specified pregnancy related conditions, third trimester: Secondary | ICD-10-CM | POA: Diagnosis present

## 2024-02-18 LAB — GC/CHLAMYDIA PROBE AMP (~~LOC~~) NOT AT ARMC
Chlamydia: NEGATIVE
Comment: NEGATIVE
Comment: NORMAL
Neisseria Gonorrhea: NEGATIVE

## 2024-02-18 LAB — CBC
HCT: 39.3 % (ref 36.0–46.0)
Hemoglobin: 12.9 g/dL (ref 12.0–15.0)
MCH: 29.2 pg (ref 26.0–34.0)
MCHC: 32.8 g/dL (ref 30.0–36.0)
MCV: 88.9 fL (ref 80.0–100.0)
Platelets: 258 K/uL (ref 150–400)
RBC: 4.42 MIL/uL (ref 3.87–5.11)
RDW: 12.9 % (ref 11.5–15.5)
WBC: 8.9 K/uL (ref 4.0–10.5)
nRBC: 0 % (ref 0.0–0.2)

## 2024-02-18 LAB — RPR: RPR Ser Ql: NONREACTIVE

## 2024-02-18 LAB — COMPREHENSIVE METABOLIC PANEL WITH GFR
ALT: 20 U/L (ref 0–44)
AST: 19 U/L (ref 15–41)
Albumin: 3.1 g/dL — ABNORMAL LOW (ref 3.5–5.0)
Alkaline Phosphatase: 77 U/L (ref 38–126)
Anion gap: 10 (ref 5–15)
BUN: 9 mg/dL (ref 6–20)
CO2: 21 mmol/L — ABNORMAL LOW (ref 22–32)
Calcium: 9.5 mg/dL (ref 8.9–10.3)
Chloride: 106 mmol/L (ref 98–111)
Creatinine, Ser: 0.61 mg/dL (ref 0.44–1.00)
GFR, Estimated: >60 mL/min (ref >60)
Glucose, Bld: 87 mg/dL (ref 70–99)
Potassium: 3.5 mmol/L (ref 3.5–5.1)
Sodium: 137 mmol/L (ref 135–145)
Total Bilirubin: 0.5 mg/dL (ref 0.0–1.2)
Total Protein: 6.4 g/dL — ABNORMAL LOW (ref 6.5–8.1)

## 2024-02-18 LAB — GROUP B STREP BY PCR: Group B strep by PCR: POSITIVE — AB

## 2024-02-18 LAB — GLUCOSE, CAPILLARY
Glucose-Capillary: 88 mg/dL (ref 70–99)
Glucose-Capillary: 78 mg/dL (ref 70–99)
Glucose-Capillary: 84 mg/dL (ref 70–99)

## 2024-02-18 LAB — TYPE AND SCREEN
ABO/RH(D): A POS
Antibody Screen: NEGATIVE

## 2024-02-18 MED ORDER — OXYCODONE-ACETAMINOPHEN 5-325 MG PO TABS: 1.0000 | ORAL_TABLET | ORAL | Status: DC | PRN

## 2024-02-18 MED ORDER — DIPHENHYDRAMINE HCL 25 MG PO CAPS: 25.0000 mg | ORAL_CAPSULE | Freq: Four times a day (QID) | ORAL | Status: DC | PRN

## 2024-02-18 MED ORDER — TERBUTALINE SULFATE 1 MG/ML IJ SOLN: 0.2500 mg | Freq: Once | INTRAMUSCULAR | Status: DC | PRN

## 2024-02-18 MED ORDER — OXYTOCIN-SODIUM CHLORIDE 30-0.9 UT/500ML-% IV SOLN: 2.5000 [IU]/h | INTRAVENOUS | Status: DC

## 2024-02-18 MED ORDER — METHYLERGONOVINE MALEATE 0.2 MG/ML IJ SOLN: 0.2000 mg | INTRAMUSCULAR | Status: DC | PRN

## 2024-02-18 MED ORDER — METHYLERGONOVINE MALEATE 0.2 MG PO TABS: 0.2000 mg | ORAL_TABLET | ORAL | Status: DC | PRN

## 2024-02-18 MED ORDER — BENZOCAINE-MENTHOL 20-0.5 % EX AERO
1.0000 | INHALATION_SPRAY | CUTANEOUS | Status: DC | PRN
  Administered 2024-02-18: 1 via TOPICAL
  Filled 2024-02-18: qty 56

## 2024-02-18 MED ORDER — DIBUCAINE (PERIANAL) 1 % EX OINT: 1.0000 | TOPICAL_OINTMENT | CUTANEOUS | Status: DC | PRN

## 2024-02-18 MED ORDER — SENNOSIDES-DOCUSATE SODIUM 8.6-50 MG PO TABS
2.0000 | ORAL_TABLET | Freq: Every day | ORAL | Status: DC
  Filled 2024-02-18: qty 2

## 2024-02-18 MED ORDER — EPHEDRINE 5 MG/ML INJ: 10.0000 mg | INTRAVENOUS | Status: DC | PRN

## 2024-02-18 MED ORDER — OXYTOCIN BOLUS FROM INFUSION
333.0000 mL | Freq: Once | INTRAVENOUS | Status: AC
  Administered 2024-02-18: 333 mL via INTRAVENOUS

## 2024-02-18 MED ORDER — WITCH HAZEL-GLYCERIN EX PADS: 1.0000 | MEDICATED_PAD | CUTANEOUS | Status: DC | PRN

## 2024-02-18 MED ORDER — LACTATED RINGERS IV SOLN: INTRAVENOUS | Status: DC

## 2024-02-18 MED ORDER — DIPHENHYDRAMINE HCL 50 MG/ML IJ SOLN: 12.5000 mg | INTRAMUSCULAR | Status: DC | PRN

## 2024-02-18 MED ORDER — ONDANSETRON HCL 4 MG PO TABS: 4.0000 mg | ORAL_TABLET | ORAL | Status: DC | PRN

## 2024-02-18 MED ORDER — ONDANSETRON HCL 4 MG/2ML IJ SOLN
4.0000 mg | Freq: Four times a day (QID) | INTRAMUSCULAR | Status: DC | PRN
  Administered 2024-02-18: 4 mg via INTRAVENOUS
  Filled 2024-02-18: qty 2

## 2024-02-18 MED ORDER — ACETAMINOPHEN 325 MG PO TABS: 650.0000 mg | ORAL_TABLET | ORAL | Status: DC | PRN

## 2024-02-18 MED ORDER — SODIUM CHLORIDE 0.9 % IV SOLN
2.0000 g | Freq: Once | INTRAVENOUS | Status: AC
  Administered 2024-02-18: 2 g via INTRAVENOUS
  Filled 2024-02-18: qty 2000

## 2024-02-18 MED ORDER — BREAST PUMP MISC: 1.0000 | Status: DC | PRN

## 2024-02-18 MED ORDER — IBUPROFEN 600 MG PO TABS
600.0000 mg | ORAL_TABLET | Freq: Four times a day (QID) | ORAL | Status: DC
  Administered 2024-02-18 – 2024-02-20 (×6): 600 mg via ORAL
  Filled 2024-02-18 (×6): qty 1

## 2024-02-18 MED ORDER — SODIUM CHLORIDE 0.9 % IV SOLN: 1.0000 g | INTRAVENOUS | Status: DC

## 2024-02-18 MED ORDER — ACETAMINOPHEN 325 MG PO TABS
650.0000 mg | ORAL_TABLET | ORAL | Status: DC | PRN
Start: 2024-02-18 — End: 2024-02-20
  Administered 2024-02-19: 650 mg via ORAL
  Filled 2024-02-18: qty 2

## 2024-02-18 MED ORDER — FENTANYL CITRATE (PF) 100 MCG/2ML IJ SOLN: 50.0000 ug | INTRAMUSCULAR | Status: DC | PRN

## 2024-02-18 MED ORDER — SIMETHICONE 80 MG PO CHEW: 80.0000 mg | CHEWABLE_TABLET | ORAL | Status: DC | PRN

## 2024-02-18 MED ORDER — LACTATED RINGERS IV SOLN
500.0000 mL | Freq: Once | INTRAVENOUS | Status: AC
  Administered 2024-02-18: 500 mL via INTRAVENOUS

## 2024-02-18 MED ORDER — LIDOCAINE HCL (PF) 1 % IJ SOLN: 30.0000 mL | INTRAMUSCULAR | Status: DC | PRN

## 2024-02-18 MED ORDER — LACTATED RINGERS IV SOLN: 500.0000 mL | INTRAVENOUS | Status: DC | PRN

## 2024-02-18 MED ORDER — MISOPROSTOL 25 MCG QUARTER TABLET: 25.0000 ug | ORAL_TABLET | Freq: Once | ORAL | Status: DC

## 2024-02-18 MED ORDER — PRENATAL MULTIVITAMIN CH
1.0000 | ORAL_TABLET | Freq: Every day | ORAL | Status: DC
  Filled 2024-02-18: qty 1

## 2024-02-18 MED ORDER — FENTANYL-BUPIVACAINE-NACL 0.5-0.125-0.9 MG/250ML-% EP SOLN
EPIDURAL | Status: AC
Start: 2024-02-18 — End: 2024-02-18
  Filled 2024-02-18: qty 250

## 2024-02-18 MED ORDER — ONDANSETRON HCL 4 MG/2ML IJ SOLN: 4.0000 mg | INTRAMUSCULAR | Status: DC | PRN

## 2024-02-18 MED ORDER — MISOPROSTOL 50MCG HALF TABLET: 50.0000 ug | ORAL_TABLET | Freq: Once | ORAL | Status: DC

## 2024-02-18 MED ORDER — PHENYLEPHRINE 80 MCG/ML (10ML) SYRINGE FOR IV PUSH (FOR BLOOD PRESSURE SUPPORT): 80.0000 ug | PREFILLED_SYRINGE | INTRAVENOUS | Status: DC | PRN

## 2024-02-18 MED ORDER — COCONUT OIL OIL
1.0000 | TOPICAL_OIL | Status: DC | PRN
  Administered 2024-02-19: 1 via TOPICAL

## 2024-02-18 MED ORDER — BUPIVACAINE HCL (PF) 0.25 % IJ SOLN
INTRAMUSCULAR | Status: DC | PRN
Start: 2024-02-18 — End: 2024-02-18
  Administered 2024-02-18: 1.6 mL via INTRATHECAL

## 2024-02-18 MED ORDER — FENTANYL-BUPIVACAINE-NACL 0.5-0.125-0.9 MG/250ML-% EP SOLN
12.0000 mL/h | EPIDURAL | Status: DC | PRN
  Administered 2024-02-18: 12 mL/h via EPIDURAL

## 2024-02-18 MED ORDER — SOD CITRATE-CITRIC ACID 500-334 MG/5ML PO SOLN: 30.0000 mL | ORAL | Status: DC | PRN

## 2024-02-18 MED ORDER — OXYTOCIN-SODIUM CHLORIDE 30-0.9 UT/500ML-% IV SOLN
1.0000 m[IU]/min | INTRAVENOUS | Status: DC
  Filled 2024-02-18: qty 500

## 2024-02-18 MED ORDER — ZOLPIDEM TARTRATE 5 MG PO TABS: 5.0000 mg | ORAL_TABLET | Freq: Every evening | ORAL | Status: DC | PRN

## 2024-02-18 MED ORDER — PRENATAL MULTIVITAMIN CH
1.0000 | ORAL_TABLET | Freq: Every day | ORAL | Status: DC
  Administered 2024-02-19: 1 via ORAL

## 2024-02-18 MED ORDER — TETANUS-DIPHTH-ACELL PERTUSSIS 5-2.5-18.5 LF-MCG/0.5 IM SUSY: 0.5000 mL | PREFILLED_SYRINGE | Freq: Once | INTRAMUSCULAR | Status: DC

## 2024-02-18 NOTE — Inpatient Diabetes Management (Signed)
 Inpatient Diabetes Program Recommendations  ADA Standards of Care 2023 Diabetes in Pregnancy Target Glucose Ranges:  Fasting: 70 - 95 mg/dL 1 hr postprandial:  889 - 140mg /dL (from first bite of meal) 2 hr postprandial:  100 - 120 mg/dL (from first bit of meal)    No results found for: GLUCAP, HGBA1C  Review of Glycemic Control  Diabetes history: GDM-diet controlled Outpatient Diabetes medications: None Current orders for Inpatient glycemic control: None  Received referral for Peripartum DM Management.   Please consider:  CBGs Q4H.  Will follow glucose trends.  If > 120 mg/dL x 2 start IV insulin.  If 100-119 mg/dL start Novolog 0-14 units Q4H.  Thank you, Wyvonna Pinal, MSN, CDCES Diabetes Coordinator Inpatient Diabetes Program [phone] (team pager from 8a-5p)

## 2024-02-18 NOTE — Lactation Note (Signed)
 This note was copied from a baby's chart. Lactation Consultation Note  Patient Name: Whitney Hickman Date: 02/18/2024 Age:23 hours Reason for consult: Initial assessment;Early term 37-38.6wks, less than 6 lbs, see MOB: MR- hx of Sickle cell trait, GDMA1, infant had blood glucose drawn but results are not back yet.   MOB feeding choice is breast and formula feeding infant.   MOB breastfeed infant for 6 minutes prior to [facility] entering the room, infant consumed 13 mls of 20 kcal formula pace feeding, infant was still cuing to feed, MOB re- latched infant on her left breast using the cross cradle hold with pillow support, infant breastfeed for another 8 minutes. MOB was doing skin to skin with infant when LC left the room. LC discussed hunger cues and signs of satiety in infant. Discussed infant's input and output.  LC discussed the importance of maternal rest, meals and hydration. MOB was made aware of O/P services, breastfeeding support groups, community resources, and our phone # for post-discharge questions.    Day 1 Feeding plan - ETI less than 6 lbs at birth. 1- MOB will continue to breast feed infant first every feeding,  every 3 hours or sooner, will limit breast feeding  to 15- 10 minutes and immediately afterwards will continue to supplement infant with formula offer pace feeding infant,  MOB will offer  ( 10-15 mls) or more if infant wants it.  2- MOB will call for further latch assistance if needed.   Maternal Data Has patient been taught Hand Expression?: Yes Does the patient have breastfeeding experience prior to this delivery?: No  Feeding Mother's Current Feeding Choice: Breast Milk and Formula  LATCH Score Latch: Grasps breast easily, tongue down, lips flanged, rhythmical sucking.  Audible Swallowing: A few with stimulation  Type of Nipple: Everted at rest and after stimulation  Comfort (Breast/Nipple): Soft / non-tender  Hold (Positioning): Assistance needed to  correctly position infant at breast and maintain latch.  LATCH Score: 8   Lactation Tools Discussed/Used    Interventions Interventions: Breast feeding basics reviewed;Assisted with latch;Skin to skin;Adjust position;Support pillows;Breast compression;Position options;Education;Pace feeding;Guidelines for Milk Supply and Pumping Schedule Handout;LC Services brochure;CDC milk storage guidelines;CDC Guidelines for Breast Pump Cleaning  Discharge Pump: DEBP;Personal  Consult Status Consult Status: Follow-up Date: 02/19/24 Follow-up type: In-patient    Whitney Hickman 02/18/2024, 7:57 PM

## 2024-02-18 NOTE — H&P (Signed)
 OBSTETRIC ADMISSION HISTORY AND PHYSICAL  Whitney Hickman is a 23 y.o. female G3P1011 with IUP at [redacted]w[redacted]d (dated by 20 wk US , Estimated Date of Delivery: 03/06/24) presenting for spontaneous labor.   She reports +FMs, No LOF, no VB, no blurry vision, headaches or peripheral edema, and RUQ pain.    She plans on breast feeding. She would like to use NFP for birth control.  She received her prenatal care at [facility]   Prenatal History/Complications:  - A1GDM (EFW 2411g, 21%tile, AC 23%tile at [redacted]w[redacted]d)  Past Medical History: Past Medical History:  Diagnosis Date   Asthma    PID (acute pelvic inflammatory disease)     Past Surgical History: Past Surgical History:  Procedure Laterality Date   DIAGNOSTIC LAPAROSCOPY WITH REMOVAL OF ECTOPIC PREGNANCY Right 06/12/2021   Procedure: EXPLORATORY  LAPAROSCOPY WITH REMOVAL OF ECTOPIC PREGNANCY;  Surgeon: Izell Harari, MD;  Location: [facility] OR;  Service: Gynecology;  Laterality: Right;   NO PAST SURGERIES      Obstetrical History: OB History     Gravida  3   Para  1   Term  1   Preterm  0   AB  1   Living  1      SAB  0   IAB  0   Ectopic  1   Multiple      Live Births  1           Social History Social History   Socioeconomic History   Marital status: Single    Spouse name: Not on file   Number of children: 1   Years of education: 12   Highest education level: 12th grade  Occupational History   Not on file  Tobacco Use   Smoking status: Former    Types: Cigars   Smokeless tobacco: Never  Vaping Use   Vaping status: Never Used  Substance and Sexual Activity   Alcohol use: Not Currently   Drug use: Not Currently    Types: Marijuana   Sexual activity: Yes    Birth control/protection: None  Other Topics Concern   Not on file  Social History Narrative   ** Merged History Encounter **       Social Drivers of Health   Financial Resource Strain: Low Risk  (01/26/2024)   Overall Financial Resource Strain  (CARDIA)    Difficulty of Paying Living Expenses: Not hard at all  Food Insecurity: No Food Insecurity (02/18/2024)   Hunger Vital Sign    Worried About Running Out of Food in the Last Year: Never true    Ran Out of Food in the Last Year: Never true  Transportation Needs: No Transportation Needs (02/18/2024)   PRAPARE - Administrator, Civil Service (Medical): No    Lack of Transportation (Non-Medical): No  Physical Activity: Insufficiently Active (01/26/2024)   Exercise Vital Sign    Days of Exercise per Week: 5 days    Minutes of Exercise per Session: 20 min  Stress: No Stress Concern Present (01/26/2024)   [employer] of Occupational Health - Occupational Stress Questionnaire    Feeling of Stress: Not at all  Social Connections: Unknown (01/26/2024)   Social Connection and Isolation Panel    Frequency of Communication with Friends and Family: Twice a week    Frequency of Social Gatherings with Friends and Family: Patient declined    Attends Religious Services: Patient declined    Database administrator or Organizations: No  Attends Banker Meetings: Not on file    Marital Status: Patient declined    Family History: Family History  Problem Relation Age of Onset   Diabetes Neg Hx    Hypertension Neg Hx     Allergies: No Known Allergies  Medications Prior to Admission  Medication Sig Dispense Refill Last Dose/Taking   Accu-Chek Softclix Lancets lancets Use four times daily to check blood sugar fasting and 2 hours after meals or as needed 200 each 5 02/17/2024   aspirin EC 81 MG tablet Take 81 mg by mouth daily. Swallow whole.   02/17/2024 Morning   Blood Pressure Monitoring (BLOOD PRESSURE CUFF) MISC 1 Units by Does not apply route once a week. 1 each 0 02/17/2024   Continuous Glucose Sensor (DEXCOM G7 SENSOR) MISC 1 Units by Does not apply route as directed. Change sensor every 10 days 3 each 3 02/17/2024   FEROSUL 325 (65 Fe) MG tablet Take 325 mg by  mouth every other day.   02/17/2024 Morning   Glucose Blood (BLOOD GLUCOSE TEST STRIPS) STRP 1 each by In Vitro route in the morning, at noon, in the evening, and at bedtime. Accu-check brand or manufacturer covered by [employer]. Use to check blood sugar fasting and 2 hours after meals or as needed 200 strip 5 02/17/2024   Misc. Devices (BREAST PUMP) MISC Dispense one breast pump for patient 1 each 0 02/17/2024   Prenatal Vit-Fe Fumarate-FA (PRENATAL MULTIVITAMIN) TABS tablet Take 1 tablet by mouth daily at 12 noon.   02/17/2024 Morning     Review of Systems  All systems reviewed and negative except as stated in HPI.  Blood pressure (!) 131/55, pulse 78, temperature 98.4 F (36.9 C), temperature source Oral, resp. rate 18, height 5' 6 (1.676 m), weight 85.3 kg, last menstrual period 05/26/2023, SpO2 100%. General appearance: alert and cooperative Lungs: breathing comfortably on room air Heart: regular rate Abdomen: soft, non-tender; gravid Extremities: no edema of bilateral lower extremities Presentation: cephalic Fetal monitoring: 115/mod/+a/none Uterine activity: every 2-4 min Dilation: 8 Effacement (%): 90 Station: -1 Exam by:: Nat Sharps RN   Prenatal labs: ABO, Rh: --/--/A POS (07/25 9048) Antibody: NEG (07/25 0951) Rubella: Immune (12/30 0000) RPR: NON REACTIVE (07/25 0951)  HBsAg: Negative (12/30 0000)  HIV: Non-reactive (12/30 0000)  GBS: POSITIVE/-- (07/25 1254)  2 hr Glucola abnl Genetic screening low risk, female Anatomy US  wnl Last US : At [redacted]w[redacted]d - cephalic presentation, EFW 2411 (21 %tile), AC 23%  Prenatal Transfer Tool  Maternal Diabetes: Yes:  Diabetes Type:  Diet controlled Genetic Screening: Normal Maternal Ultrasounds/Referrals: Normal Fetal Ultrasounds or other Referrals:  None Maternal Substance Abuse:  No Significant Maternal Medications:  None Significant Maternal Lab Results:  Group B Strep positive Number of Prenatal Visits:greater than 3  verified prenatal visits Other Comments:  None  Results for orders placed or performed during the hospital encounter of 02/18/24 (from the past 24 hours)  CBC   Collection Time: 02/18/24  9:51 AM  Result Value Ref Range   WBC 8.9 4.0 - 10.5 K/uL   RBC 4.42 3.87 - 5.11 MIL/uL   Hemoglobin 12.9 12.0 - 15.0 g/dL   HCT 60.6 63.9 - 53.9 %   MCV 88.9 80.0 - 100.0 fL   MCH 29.2 26.0 - 34.0 pg   MCHC 32.8 30.0 - 36.0 g/dL   RDW 87.0 88.4 - 84.4 %   Platelets 258 150 - 400 K/uL   nRBC 0.0 0.0 - 0.2 %  RPR   Collection Time: 02/18/24  9:51 AM  Result Value Ref Range   RPR Ser Ql NON REACTIVE NON REACTIVE  Comprehensive metabolic panel   Collection Time: 02/18/24  9:51 AM  Result Value Ref Range   Sodium 137 135 - 145 mmol/L   Potassium 3.5 3.5 - 5.1 mmol/L   Chloride 106 98 - 111 mmol/L   CO2 21 (L) 22 - 32 mmol/L   Glucose, Bld 87 70 - 99 mg/dL   BUN 9 6 - 20 mg/dL   Creatinine, Ser 9.38 0.44 - 1.00 mg/dL   Calcium 9.5 8.9 - 89.6 mg/dL   Total Protein 6.4 (L) 6.5 - 8.1 g/dL   Albumin 3.1 (L) 3.5 - 5.0 g/dL   AST 19 15 - 41 U/L   ALT 20 0 - 44 U/L   Alkaline Phosphatase 77 38 - 126 U/L   Total Bilirubin 0.5 0.0 - 1.2 mg/dL   GFR, Estimated >39 >39 mL/min   Anion gap 10 5 - 15  Type and screen   Collection Time: 02/18/24  9:51 AM  Result Value Ref Range   ABO/RH(D) A POS    Antibody Screen NEG    Sample Expiration      02/21/2024,2359 Performed at [facility], [address], [place], [place] [address]   Glucose, capillary   Collection Time: 02/18/24 10:28 AM  Result Value Ref Range   Glucose-Capillary 88 70 - 99 mg/dL  Glucose, capillary   Collection Time: 02/18/24 12:45 PM  Result Value Ref Range   Glucose-Capillary 78 70 - 99 mg/dL   Comment 1 Notify RN   Group B strep by PCR   Collection Time: 02/18/24 12:54 PM   Specimen: Vaginal/Rectal; Genital  Result Value Ref Range   Group B strep by PCR POSITIVE (A) PRESUMPTIVE NEGATIVE  Results for orders placed  or performed in visit on 02/17/24 (from the past 24 hours)  GC/Chlamydia probe amp ([place])not at [facility]   Collection Time: 02/17/24  3:55 PM  Result Value Ref Range   Neisseria Gonorrhea Negative    Chlamydia Negative    Comment Normal Reference Ranger Chlamydia - Negative    Comment      Normal Reference Range Neisseria Gonorrhea - Negative    Patient Active Problem List   Diagnosis Date Noted   Diet controlled gestational diabetes mellitus (GDM) in third trimester 02/18/2024   Supervision of high risk pregnancy, antepartum 12/27/2023   DM (diabetes mellitus), gestational, diet-controlled 12/23/2023   Alpha thalassemia silent carrier 12/15/2023   Sickle-cell trait (HCC) 12/15/2023   Poor fetal growth affecting management of mother in second trimester (RESOLVED) 11/22/2023   History of ectopic pregnancy 07/15/2021   Foster care (status) 06/02/2018    Assessment/Plan:  Deyra Perdomo is a 23 y.o. G3P1011 at [redacted]w[redacted]d here for active labor  #Labor: Expectant management for now, consider AROM vs Pitocin  for augmentation  #Pain: Epidural #FWB: Cat II #ID:  GBS pos on PCR > Ampicillin #MOF: Breast #MOC: None #Circ:  Yes  #A1GDM: CBG q4h  AGA on last growth  Alain Sor, MD OB Fellow, Faculty Practice [facility], Center for [facility] 02/18/2024 2:13 PM

## 2024-02-18 NOTE — Anesthesia Preprocedure Evaluation (Signed)
 Anesthesia Evaluation  Patient identified by MRN, date of birth, ID band Patient awake    Reviewed: Allergy & Precautions, NPO status , Patient's Chart, lab work & pertinent test results  Airway Mallampati: II  TM Distance: >3 FB Neck ROM: Full    Dental no notable dental hx.    Pulmonary asthma , former smoker   Pulmonary exam normal breath sounds clear to auscultation       Cardiovascular negative cardio ROS Normal cardiovascular exam Rhythm:Regular Rate:Normal     Neuro/Psych negative neurological ROS  negative psych ROS   GI/Hepatic negative GI ROS, Neg liver ROS,,,  Endo/Other  diabetes, Gestational    Renal/GU negative Renal ROS  negative genitourinary   Musculoskeletal negative musculoskeletal ROS (+)    Abdominal   Peds  Hematology negative hematology ROS (+)   Anesthesia Other Findings   Reproductive/Obstetrics (+) Pregnancy                              Anesthesia Physical Anesthesia Plan  ASA: 3  Anesthesia Plan: Epidural   Post-op Pain Management:    Induction:   PONV Risk Score and Plan: Treatment may vary due to age or medical condition  Airway Management Planned: Natural Airway  Additional Equipment:   Intra-op Plan:   Post-operative Plan:   Informed Consent: I have reviewed the patients History and Physical, chart, labs and discussed the procedure including the risks, benefits and alternatives for the proposed anesthesia with the patient or authorized representative who has indicated his/her understanding and acceptance.       Plan Discussed with: Anesthesiologist  Anesthesia Plan Comments: (Patient identified. Risks, benefits, options discussed with patient including but not limited to bleeding, infection, nerve damage, paralysis, failed block, incomplete pain control, headache, blood pressure changes, nausea, vomiting, reactions to medication, itching,  and post partum back pain. Confirmed with bedside nurse the patient's most recent platelet count. Confirmed with the patient that they are not taking any anticoagulation, have any bleeding history or any family history of bleeding disorders. Patient expressed understanding and wishes to proceed. All questions were answered. )         Anesthesia Quick Evaluation

## 2024-02-18 NOTE — Anesthesia Procedure Notes (Addendum)
 Epidural Patient location during procedure: OB Start time: 02/18/2024 10:55 AM End time: 02/18/2024 11:05 AM  Staffing Anesthesiologist: Niels Marien CROME, MD Performed: anesthesiologist   Preanesthetic Checklist Completed: patient identified, IV checked, risks and benefits discussed, monitors and equipment checked, pre-op evaluation and timeout performed  Epidural Patient position: sitting Prep: DuraPrep and site prepped and draped Patient monitoring: continuous pulse ox, blood pressure, heart rate and cardiac monitor Approach: midline Location: L3-L4 Injection technique: LOR air  Needle:  Needle type: Tuohy  Needle gauge: 17 G Needle length: 9 cm Needle insertion depth: 7 cm Catheter type: closed end flexible Catheter size: 19 Gauge Catheter at skin depth: 12 cm Test dose: negative  Assessment Sensory level: T8 Events: blood not aspirated, no cerebrospinal fluid, injection not painful, no injection resistance, no paresthesia and negative IV test  Additional Notes Patient identified. Risks/Benefits/Options discussed with patient including but not limited to bleeding, infection, nerve damage, paralysis, failed block, incomplete pain control, headache, blood pressure changes, nausea, vomiting, reactions to medication both or allergic, itching and postpartum back pain. Confirmed with bedside nurse the patient's most recent platelet count. Confirmed with patient that they are not currently taking any anticoagulation, have any bleeding history or any family history of bleeding disorders. Patient expressed understanding and wished to proceed. All questions were answered. Sterile technique was used throughout the entire procedure. Please see nursing notes for vital signs. Test dose was given through epidural catheter and negative prior to continuing to dose epidural or start infusion. Warning signs of high block given to the patient including shortness of breath, tingling/numbness in  hands, complete motor block, or any concerning symptoms with instructions to call for help. Patient was given instructions on fall risk and not to get out of bed. All questions and concerns addressed with instructions to call with any issues or inadequate analgesia.    CSE performed 2/2 advanced cervical exam with poor patient coping. LOR at 7cm. 25G pencan inserted into intrathecal space with return of clear CSF. Marcaine  administered intrathecally as per chart. Epidural catheter inserted into epidural space and left at 12cm at skin. Pt tolerated well. VSS. Reason for block:procedure for pain

## 2024-02-18 NOTE — MAU Note (Signed)
.  Whitney Hickman is a 23 y.o. at [redacted]w[redacted]d here in MAU reporting: contractions and light vaginal bleeding that started around 1800 yesterday. She also states she hasn't felt baby move since the contractions started.     Pain score: 10/10 Vitals:   02/18/24 0930  BP: 125/64  Pulse: 93  Resp: 18  Temp: 98.2 F (36.8 C)  SpO2: 98%     FHT: 170  Lab orders placed from triage:

## 2024-02-19 LAB — CBC
HCT: 35.3 % — ABNORMAL LOW (ref 36.0–46.0)
Hemoglobin: 11.7 g/dL — ABNORMAL LOW (ref 12.0–15.0)
MCH: 29.8 pg (ref 26.0–34.0)
MCHC: 33.1 g/dL (ref 30.0–36.0)
MCV: 89.8 fL (ref 80.0–100.0)
Platelets: 248 K/uL (ref 150–400)
RBC: 3.93 MIL/uL (ref 3.87–5.11)
RDW: 13 % (ref 11.5–15.5)
WBC: 10.8 K/uL — ABNORMAL HIGH (ref 4.0–10.5)
nRBC: 0 % (ref 0.0–0.2)

## 2024-02-19 LAB — STREP GP B NAA: Strep Gp B NAA: POSITIVE — AB

## 2024-02-19 MED ORDER — OXYCODONE HCL 5 MG PO TABS
10.0000 mg | ORAL_TABLET | Freq: Four times a day (QID) | ORAL | Status: DC | PRN
  Administered 2024-02-19 – 2024-02-20 (×4): 10 mg via ORAL
  Filled 2024-02-19 (×4): qty 2

## 2024-02-19 NOTE — Plan of Care (Signed)
  Problem: Education: Goal: Knowledge of condition will improve Outcome: Completed/Met

## 2024-02-19 NOTE — Lactation Note (Addendum)
 This note was copied from a baby's chart. Lactation Consultation Note  Patient Name: Whitney Hickman Unijb'd Date: 02/19/2024 Age:23 hours  Reason for consult: Follow-up assessment;Early term 37-38.6wks;Infant < 6lbs (sore right nipple)  P2, [redacted]w[redacted]d, 2% weight loss  Mother has been latching baby and supplementing with Neosure formula. She states her right nipple hurts and she wants to stop latching baby and pump only. She has a small sucking blister on the tip of her nipple that is painful when baby latches.  Instructed mother on the use, frequency, cleaning of breast pump and storage of breast milk. She was fitted with 18 flanges and provided coconut oil. Mother was instructed how to pump in the initiate setting. Discussed pumping every 3 hours to stimulate milk production. Reviewed milk collection and storage guidelines. Advised to give baby her expressed milk first then formula.  Mother has a manual pump for home use. Instructed to ask for assistance with pumping, as needed.    Maternal Data Does the patient have breastfeeding experience prior to this delivery?: No  Feeding Mother's Current Feeding Choice: Breast Milk and Formula Nipple Type: Nfant Standard Flow (white)  LATCH Score  Has decided to only pump    Lactation Tools Discussed/Used Tools: Pump;Flanges;Coconut oil Flange Size: 18 Breast pump type: Double-Electric Breast Pump;Manual Pump Education: Setup, frequency, and cleaning;Milk Storage Reason for Pumping: ETI, < 6 lbs Pumping frequency: every 3 hours for 15 min, both breast  Interventions Interventions: Breast feeding basics reviewed;DEBP;Education;CDC milk storage guidelines;CDC Guidelines for Breast Pump Cleaning  Discharge Pump: Manual  Consult Status Consult Status: Follow-up Date: 02/19/24 Follow-up type: In-patient    Joshua Line M 02/19/2024, 3:46 PM

## 2024-02-19 NOTE — Progress Notes (Signed)
 Post Partum Day 1 Subjective: Patient reports some right sided abdominal pain but also very full bladder. Reports improvement after emptying bladder. Otherwise doing well. No concerns.   Objective: Blood pressure 115/68, pulse (!) 53, temperature 98.8 F (37.1 C), temperature source Oral, resp. rate 18, height 5' 6 (1.676 m), weight 85.3 kg, last menstrual period 05/26/2023, SpO2 99%, unknown if currently breastfeeding.  Physical Exam:  General: alert, cooperative, and no distress Lochia: appropriate Uterine Fundus: firm Incision: n/a DVT Evaluation: No evidence of DVT seen on physical exam.  Recent Labs    02/18/24 0951 02/19/24 0430  HGB 12.9 11.7*  HCT 39.3 35.3*    Assessment/Plan: Plan for discharge tomorrow, Breastfeeding, and Circumcision prior to discharge  MD aware of need for circumcision consent.   Abdominal binder ordered and encouraged frequent emptying of bladder on pp. Planning NFP for contraception.   LOS: 1 day   Whitney Hickman, [place] 02/19/2024, 8:26 AM

## 2024-02-19 NOTE — Anesthesia Postprocedure Evaluation (Signed)
 Anesthesia Post Note  Patient: Whitney Hickman  Procedure(s) Performed: AN AD HOC LABOR EPIDURAL     Patient location during evaluation: Mother Baby Anesthesia Type: Epidural Level of consciousness: awake and alert Pain management: pain level controlled Vital Signs Assessment: post-procedure vital signs reviewed and stable Respiratory status: spontaneous breathing, nonlabored ventilation and respiratory function stable Cardiovascular status: stable Postop Assessment: no headache, no backache and epidural receding Anesthetic complications: no   No notable events documented.  Last Vitals:  Vitals:   02/19/24 0334 02/19/24 0808  BP: (!) 92/58 115/68  Pulse: 62 (!) 53  Resp: 18   Temp: 36.8 C 37.1 C  SpO2: 99% 99%    Last Pain:  Vitals:   02/19/24 0808  TempSrc: Oral  PainSc:    Pain Goal:                   Aloysius Heinle

## 2024-02-19 NOTE — Progress Notes (Signed)
 The Rn called Providers again regarding medication for patient. The provider is ordering oxycodone .

## 2024-02-20 MED ORDER — IBUPROFEN 600 MG PO TABS: 600.0000 mg | ORAL_TABLET | Freq: Four times a day (QID) | ORAL | 0 refills | Status: AC

## 2024-02-20 MED ORDER — SENNOSIDES-DOCUSATE SODIUM 8.6-50 MG PO TABS: 2.0000 | ORAL_TABLET | Freq: Every day | ORAL | 0 refills | Status: AC

## 2024-02-20 MED ORDER — ACETAMINOPHEN 325 MG PO TABS: 650.0000 mg | ORAL_TABLET | ORAL | 0 refills | Status: AC | PRN

## 2024-02-20 MED ORDER — CYCLOBENZAPRINE HCL 5 MG PO TABS
10.0000 mg | ORAL_TABLET | Freq: Three times a day (TID) | ORAL | Status: DC | PRN
  Administered 2024-02-20: 10 mg via ORAL
  Filled 2024-02-20: qty 2

## 2024-02-20 MED ORDER — CYCLOBENZAPRINE HCL 10 MG PO TABS: 10.0000 mg | ORAL_TABLET | Freq: Three times a day (TID) | ORAL | 0 refills | Status: AC | PRN

## 2024-02-20 NOTE — Lactation Note (Signed)
 This note was copied from a baby's chart. Lactation Consultation Note  Patient Name: Whitney Hickman Date: 02/20/2024 Age:23 hours  Reason for consult: Follow-up assessment;Early term 37-38.6wks;1st time breastfeeding  P2, [redacted]w[redacted]d, 3% weight loss  Mother had planned to pump and bottle feed when I met with her yesterday. She reports she has not pumped due to right nipple soreness. Baby has been bottle feeding well. Mother says she plans to pump and bottle once home. Discussed pumping 8 times in 24 hours to establish and maintain milk supply.   Discussed management of engorgement, signs and symptom of mastitis and if occurs to report to patient's OB provider. Discussed Lactogenesis II/ supply and demand for maintaining milk supply.       Mom made aware of O/P services, breastfeeding support groups, community resources, and our phone # for post-discharge questions.     Feeding Mother's Current Feeding Choice: Formula  Interventions Interventions: Breast feeding basics reviewed;Education  Discharge Discharge Education: Engorgement and breast care;Warning signs for feeding baby Pump: Manual;Personal  Consult Status Consult Status: Complete Date: 02/20/24    Joshua Rojelio HERO 02/20/2024, 12:12 PM

## 2024-02-20 NOTE — Discharge Summary (Signed)
 Postpartum Discharge Summary     Patient Name: Whitney Hickman DOB: [birth year] MRN: [id number]  Date of admission: 02/18/2024 Delivery date:02/18/2024 Delivering provider: JAYNE VONN DEL Date of discharge: 02/20/2024  Admitting diagnosis: Diet controlled gestational diabetes mellitus (GDM) in third trimester [O24.410] Intrauterine pregnancy: [redacted]w[redacted]d     Secondary diagnosis:  Principal Problem:   Diet controlled gestational diabetes mellitus (GDM) in third trimester  Additional problems: n/a    Discharge diagnosis: Term Pregnancy Delivered and GDM A1                                              Post partum procedures:n/a Augmentation: N/A Complications: None  Hospital course: Onset of Labor With Vaginal Delivery      23 y.o. yo [id number] at [redacted]w[redacted]d was admitted in Latent Labor on 02/18/2024. Labor course was uncomplicated.  Membrane Rupture Time/Date: 2:00 PM,02/18/2024  Delivery Method:Vaginal, Spontaneous Operative Delivery:N/A Episiotomy: None Lacerations:  None Patient had a postpartum course that was uncomplicated.  She is ambulating, tolerating a regular diet, passing flatus, and urinating well. Patient is discharged home in stable condition on 02/20/24.  Newborn Data: Birth date:02/18/2024 Birth time:3:57 PM Gender:Female Living status:Living Apgars:9 ,9  Weight:2630 g  Magnesium Sulfate received: No BMZ received: No Rhophylac:N/A MMR:N/A T-DaP:Given prenatally Flu: No RSV Vaccine received: No Transfusion:No  Immunizations received: Immunization History  Administered Date(s) Administered   PFIZER(Purple Top)SARS-COV-2 Vaccination 08/01/2020   Tdap 12/14/2023    Physical exam  Vitals:   02/19/24 0808 02/19/24 1502 02/19/24 2116 02/20/24 0558  BP: 115/68 (!) 109/59 (!) 118/57 112/82  Pulse: (!) 53 (!) 59 (!) 57 (!) 59  Resp: 18  19 18   Temp: 98.8 F (37.1 C) 97.7 F (36.5 C) 97.8 F (36.6 C) 98 F (36.7 C)  TempSrc: Oral Oral Oral Oral  SpO2: 99% 100% 99%  99%  Weight:      Height:       General: alert, cooperative, and no distress Lochia: appropriate Uterine Fundus: firm Incision: N/A DVT Evaluation: No evidence of DVT seen on physical exam. Labs: Lab Results  Component Value Date   WBC 10.8 (H) 02/19/2024   HGB 11.7 (L) 02/19/2024   HCT 35.3 (L) 02/19/2024   MCV 89.8 02/19/2024   PLT 248 02/19/2024      Latest Ref Rng & Units 02/18/2024    9:51 AM  CMP  Glucose 70 - 99 mg/dL 87   BUN 6 - 20 mg/dL 9   Creatinine 9.55 - 8.99 mg/dL 9.38   Sodium 864 - 854 mmol/L 137   Potassium 3.5 - 5.1 mmol/L 3.5   Chloride 98 - 111 mmol/L 106   CO2 22 - 32 mmol/L 21   Calcium 8.9 - 10.3 mg/dL 9.5   Total Protein 6.5 - 8.1 g/dL 6.4   Total Bilirubin 0.0 - 1.2 mg/dL 0.5   Alkaline Phos 38 - 126 U/L 77   AST 15 - 41 U/L 19   ALT 0 - 44 U/L 20    Edinburgh Score:    02/19/2024    2:31 AM  Edinburgh Postnatal Depression Scale Screening Tool  I have been able to laugh and see the funny side of things. 0  I have looked forward with enjoyment to things. 0  I have blamed myself unnecessarily when things went wrong. 0  I have been anxious or  worried for no good reason. 0  I have felt scared or panicky for no good reason. 0  Things have been getting on top of me. 0  I have been so unhappy that I have had difficulty sleeping. 0  I have felt sad or miserable. 0  I have been so unhappy that I have been crying. 0  The thought of harming myself has occurred to me. 0  Edinburgh Postnatal Depression Scale Total 0   Edinburgh Postnatal Depression Scale Total: 0   After visit meds:  Allergies as of 02/20/2024   No Known Allergies      Medication List     STOP taking these medications    aspirin EC 81 MG tablet       TAKE these medications    Accu-Chek Softclix Lancets lancets Use four times daily to check blood sugar fasting and 2 hours after meals or as needed   acetaminophen  325 MG tablet Commonly known as: Tylenol  Take 2  tablets (650 mg total) by mouth every 4 (four) hours as needed (for pain scale < 4).   BLOOD GLUCOSE TEST STRIPS Strp 1 each by In Vitro route in the morning, at noon, in the evening, and at bedtime. Accu-check brand or manufacturer covered by [employer]. Use to check blood sugar fasting and 2 hours after meals or as needed   Blood Pressure Cuff Misc 1 Units by Does not apply route once a week.   Breast Pump Misc Dispense one breast pump for patient   cyclobenzaprine  10 MG tablet Commonly known as: FLEXERIL  Take 1 tablet (10 mg total) by mouth 3 (three) times daily as needed for muscle spasms.   Dexcom G7 Sensor Misc 1 Units by Does not apply route as directed. Change sensor every 10 days   FeroSul 325 (65 Fe) MG tablet Generic drug: ferrous sulfate Take 325 mg by mouth every other day.   ibuprofen  600 MG tablet Commonly known as: ADVIL  Take 1 tablet (600 mg total) by mouth every 6 (six) hours.   prenatal multivitamin Tabs tablet Take 1 tablet by mouth daily at 12 noon.   senna-docusate 8.6-50 MG tablet Commonly known as: Senokot-S Take 2 tablets by mouth daily.         Discharge home in stable condition Infant Feeding: Bottle Infant Disposition:home with mother Discharge instruction: per After Visit Summary and Postpartum booklet. Activity: Advance as tolerated. Pelvic rest for 6 weeks.  Diet: routine diet Future Appointments: Future Appointments  Date Time Provider Department Center  02/24/2024  8:45 AM [facility]  02/24/2024  2:35 PM Izell Harari, MD [facility]  03/02/2024  9:45 AM [facility] NST [facility]  03/02/2024  3:15 PM Izell Harari, MD [facility]   Follow up Visit: Message to [facility] 7/27  Please schedule this patient for a In person postpartum visit in 4 weeks with the following provider: Any provider. Additional Postpartum F/U:2 hour GTT  High risk pregnancy complicated by: GDM Delivery mode:  Vaginal,  Spontaneous Anticipated Birth Control:  NFP   02/20/2024 Augustin JAYSON Slade, MD

## 2024-02-21 ENCOUNTER — Ambulatory Visit: Payer: Self-pay | Admitting: Obstetrics and Gynecology

## 2024-02-21 DIAGNOSIS — O0990 Supervision of high risk pregnancy, unspecified, unspecified trimester: Secondary | ICD-10-CM

## 2024-02-21 DIAGNOSIS — B951 Streptococcus, group B, as the cause of diseases classified elsewhere: Secondary | ICD-10-CM | POA: Insufficient documentation

## 2024-02-24 ENCOUNTER — Other Ambulatory Visit

## 2024-02-24 ENCOUNTER — Ambulatory Visit

## 2024-02-24 ENCOUNTER — Encounter: Admitting: Obstetrics and Gynecology

## 2024-02-29 ENCOUNTER — Inpatient Hospital Stay (HOSPITAL_COMMUNITY): Admission: AD | Admit: 2024-02-29 | Source: Home / Self Care | Admitting: Family Medicine

## 2024-02-29 ENCOUNTER — Inpatient Hospital Stay (HOSPITAL_COMMUNITY)

## 2024-03-02 ENCOUNTER — Encounter: Admitting: Obstetrics and Gynecology

## 2024-03-02 ENCOUNTER — Ambulatory Visit

## 2024-03-06 ENCOUNTER — Telehealth: Payer: Self-pay | Admitting: Family Medicine

## 2024-03-06 NOTE — Telephone Encounter (Signed)
 Patient called wanting to be seen for thrush and pain in her boob. She says that she is not breastfeeding just pumping. I let the patient know about the lactation support group tomorrow and the patient declined. I let the patient know that one of our lactation specialist will give her a call in order to schedule her an accordingly.

## 2024-03-06 NOTE — Telephone Encounter (Signed)
 Called mom to assess breast feeding status and to offer and OP Lactation appointment.  Mom not interested in coming to an appointment only wants medication, LC encouraged to coming to support group tomorrow from 10 am-12 pm for assessment an help. Mom said she is not breastfeeding/latching is only pumping.  Baby is being treated by pediatrician and mom read online that moms need to get treated too.  LC tried asking about symptoms of thrush on moms breast and she insisted she only need medication because baby is getting medication.    [employer]

## 2024-03-07 ENCOUNTER — Telehealth (HOSPITAL_COMMUNITY): Payer: Self-pay | Admitting: *Deleted

## 2024-03-07 NOTE — Telephone Encounter (Signed)
 03/07/2024  Name: Whitney Hickman MRN: [id number] DOB: Jan 06, 2001  Reason for Call:  Transition of Care Hospital Discharge Call  Contact Status: Patient Contact Status: Complete  Language assistant needed: Interpreter Mode: Interpreter Not Needed        Follow-Up Questions: Do You Have Any Concerns About Your Health As You Heal From Delivery?: No Do You Have Any Concerns About Your Infants Health?: No  Edinburgh Postnatal Depression Scale:  In the Past 7 Days:    PHQ2-9 Depression Scale:     Discharge Follow-up: Edinburgh score requires follow up?:  (patient says answers are the same as in the hospital when score was 0, endorses she is doing well emotionally) Patient was advised of the following resources:: Support Group, Breastfeeding Support Group  Post-discharge interventions: Reviewed Newborn Safe Sleep Practices  Mliss Sieve, RN 03/07/2024 14:49

## 2024-04-04 ENCOUNTER — Other Ambulatory Visit: Payer: Self-pay

## 2024-04-04 DIAGNOSIS — O24410 Gestational diabetes mellitus in pregnancy, diet controlled: Secondary | ICD-10-CM

## 2024-04-04 DIAGNOSIS — O0990 Supervision of high risk pregnancy, unspecified, unspecified trimester: Secondary | ICD-10-CM

## 2024-04-06 ENCOUNTER — Other Ambulatory Visit

## 2024-04-06 ENCOUNTER — Other Ambulatory Visit: Payer: Self-pay

## 2024-04-06 ENCOUNTER — Ambulatory Visit: Admitting: Obstetrics and Gynecology

## 2024-04-06 DIAGNOSIS — O24410 Gestational diabetes mellitus in pregnancy, diet controlled: Secondary | ICD-10-CM

## 2024-04-06 DIAGNOSIS — O0990 Supervision of high risk pregnancy, unspecified, unspecified trimester: Secondary | ICD-10-CM

## 2024-04-06 NOTE — Progress Notes (Signed)
 Post Partum Visit Note  Whitney Hickman is a 23 y.o. G79P2012 female who presents for a postpartum visit. She is 7 weeks postpartum following a normal spontaneous vaginal delivery.  I have fully reviewed the prenatal and intrapartum course. The delivery was at 37.4 gestational weeks.  Anesthesia: epidural. Postpartum course has been good. Baby is doing well. Baby is feeding by bottle. Bleeding no bleeding. Bowel function is normal. Bladder function is normal. Patient is not sexually active. Contraception method is NFP. Postpartum depression screening: negative.   The pregnancy intention screening data noted above was reviewed. Potential methods of contraception were discussed. The patient elected to proceed with No data recorded.   Edinburgh Postnatal Depression Scale - 04/06/24 0859       Edinburgh Postnatal Depression Scale:  In the Past 7 Days   I have been able to laugh and see the funny side of things. 0    I have looked forward with enjoyment to things. 0    I have blamed myself unnecessarily when things went wrong. 0    I have been anxious or worried for no good reason. 0    I have felt scared or panicky for no good reason. 0    Things have been getting on top of me. 0    I have been so unhappy that I have had difficulty sleeping. 0    I have felt sad or miserable. 0    I have been so unhappy that I have been crying. 0    The thought of harming myself has occurred to me. 0    Edinburgh Postnatal Depression Scale Total 0          Health Maintenance Due  Topic Date Due   HPV VACCINES (1 - Risk 3-dose series) Never done   Meningococcal B Vaccine (1 of 2 - Standard) Never done   Pneumococcal Vaccine (1 of 2 - PCV) Never done   Hepatitis B Vaccines 19-59 Average Risk (1 of 3 - 19+ 3-dose series) Never done   COVID-19 Vaccine (2 - Pfizer risk series) 08/22/2020   Influenza Vaccine  02/25/2024    The following portions of the patient's history were reviewed and updated as  appropriate: allergies, current medications, past family history, past medical history, past social history, past surgical history, and problem list.  Review of Systems Pertinent items are noted in HPI.  Objective:  BP 110/69   Pulse 88   Wt 175 lb 1.6 oz (79.4 kg)   LMP 05/26/2023 (Exact Date)   BMI 28.26 kg/m    General:  alert and cooperative   Breasts:  normal  Lungs: normal  Heart:  Regular rate  -      GU exam:  not indicated       Assessment:   1. Postpartum exam (Primary)   2. Diet controlled gestational diabetes mellitus (GDM) in third trimester Repeating 2hr gtt today   Plan:   Essential components of care per ACOG recommendations:  1.  Mood and well being: Patient with negative depression screening today. Reviewed local resources for support.  - Patient tobacco use?   - hx of drug use? No.    2. Infant care and feeding:  -Patient currently breastmilk feeding? No.  -Social determinants of health (SDOH) reviewed in EPIC. No concerns  3. Sexuality, contraception and birth spacing - Patient does not want a pregnancy in the next year.   - Reviewed reproductive life planning. Reviewed contraceptive methods based on  pt preferences and effectiveness.  Patient desired NFP today.   - Discussed birth spacing of 18 months  4. Sleep and fatigue -Encouraged family/partner/community support of 4 hrs of uninterrupted sleep to help with mood and fatigue  5. Physical Recovery  - Discussed patients delivery and complications. She describes her labor as good. - Patient had a Vaginal, no problems at delivery. Patient had a none laceration. Perineal healing reviewed. Patient expressed understanding - Patient has urinary incontinence? No. - Patient is safe to resume physical and sexual activity  6.  Health Maintenance - HM due items addressed Yes - Last pap smear 2024 Pap smear not done at today's visit.  -Breast Cancer screening indicated? No.   7. Chronic  Disease/Pregnancy Condition follow up: Gestational Diabetes  - PCP follow up  Nidia Daring, FNP Center for [facility], [facility]

## 2024-04-06 NOTE — Progress Notes (Deleted)
    Post Partum Visit Note  Whitney Hickman is a 23 y.o. G7P2012 female who presents for a postpartum visit. She is 6 weeks postpartum following a normal spontaneous vaginal delivery.  I have fully reviewed the prenatal and intrapartum course. The delivery was at 37 gestational weeks.  Anesthesia: epidural. Postpartum course has been ***. Baby is doing well***. Baby is feeding by bottle - Similac Neosure. Bleeding no bleeding. Bowel function is {normal:32111}. Bladder function is {normal:32111}. Patient {is/is not:9024} sexually active. Contraception method is {contraceptive method:5051}. Postpartum depression screening: {gen negative/positive:315881}.   The pregnancy intention screening data noted above was reviewed. Potential methods of contraception were discussed. The patient elected to proceed with No data recorded.    Health Maintenance Due  Topic Date Due   HPV VACCINES (1 - Risk 3-dose series) Never done   Meningococcal B Vaccine (1 of 2 - Standard) Never done   Pneumococcal Vaccine (1 of 2 - PCV) Never done   Hepatitis B Vaccines 19-59 Average Risk (1 of 3 - 19+ 3-dose series) Never done   COVID-19 Vaccine (2 - Pfizer risk series) 08/22/2020   Influenza Vaccine  02/25/2024    {Common ambulatory SmartLinks:19316}  Review of Systems {ros; complete:30496}  Objective:  LMP 05/26/2023 (Exact Date)    General:  {gen appearance:16600}   Breasts:  {desc; normal/abnormal/not indicated:14647}  Lungs: {lung exam:16931}  Heart:  {heart exam:5510}  Abdomen: {abdomen exam:16834}   Wound {Wound assessment:11097}  GU exam:  {desc; normal/abnormal/not indicated:14647}       Assessment:    There are no diagnoses linked to this encounter.  *** postpartum exam.   Plan:   Essential components of care per ACOG recommendations:  1.  Mood and well being: Patient with {gen negative/positive:315881} depression screening today. Reviewed local resources for support.  - Patient tobacco use?  {tobacco use:25506}  - hx of drug use? {yes/no:25505}    2. Infant care and feeding:  -Patient currently breastmilk feeding? {yes/no:25502}  -Social determinants of health (SDOH) reviewed in EPIC. No concerns***The following needs were identified***  3. Sexuality, contraception and birth spacing - Patient {DOES_DOES WNU:81435} want a pregnancy in the next year.  Desired family size is {NUMBER 1-10:22536} children.  - Reviewed reproductive life planning. Reviewed contraceptive methods based on pt preferences and effectiveness.  Patient desired {[employer] Methods:24109} today.   - Discussed birth spacing of 18 months  4. Sleep and fatigue -Encouraged family/partner/community support of 4 hrs of uninterrupted sleep to help with mood and fatigue  5. Physical Recovery  - Discussed patients delivery and complications. She describes her labor as {description:25511} - Patient had a {CHL AMB [phone]}. Patient had a {laceration:25518} laceration. Perineal healing reviewed. Patient expressed understanding - Patient has urinary incontinence? {yes/no:25515} - Patient {ACTION; IS/IS WNU:[id number]} safe to resume physical and sexual activity  6.  Health Maintenance - HM due items addressed {Yes or If no, why not?:20788} - Last pap smear No results found for: DIAGPAP Pap smear {done:10129} at today's visit.  -Breast Cancer screening indicated? {indicated:25516}  7. Chronic Disease/Pregnancy Condition follow up: {Follow up:25499}  - PCP follow up  Cooper Pouch, RMA Center for [facility], [facility]

## 2024-04-07 ENCOUNTER — Ambulatory Visit: Payer: Self-pay | Admitting: Obstetrics and Gynecology

## 2024-04-07 LAB — GLUCOSE TOLERANCE, 2 HOURS
Glucose, 2 hour: 95 mg/dL (ref 70–139)
Glucose, GTT - Fasting: 88 mg/dL (ref 70–99)

## 2024-07-08 ENCOUNTER — Telehealth: Admitting: Family Medicine

## 2024-07-08 DIAGNOSIS — B001 Herpesviral vesicular dermatitis: Secondary | ICD-10-CM

## 2024-07-08 MED ORDER — VALACYCLOVIR HCL 1 G PO TABS: ORAL_TABLET | ORAL | 0 refills | Status: AC

## 2024-07-08 NOTE — Patient Instructions (Signed)
 Whitney Hickman, thank you for joining Roosvelt Mater, PA-C for today's virtual visit.  While this provider is not your primary care provider (PCP), if your PCP is located in our provider database this encounter information will be shared with them immediately following your visit.   [facility] MyChart account gives you access to today's visit and all your visits, tests, and labs performed at [facility]  click here if you don't have a [facility] MyChart account or go to [email or web address]  Consent: (Patient) Whitney Hickman provided verbal consent for this virtual visit at the beginning of the encounter.  Current Medications:  Current Outpatient Medications:    valACYclovir  (VALTREX ) 1000 MG tablet, 2,000 mg (2 tablets) orally every 12 hours x 1 day, Disp: 4 tablet, Rfl: 0   Accu-Chek Softclix Lancets lancets, Use four times daily to check blood sugar fasting and 2 hours after meals or as needed, Disp: 200 each, Rfl: 5   acetaminophen  (TYLENOL ) 325 MG tablet, Take 2 tablets (650 mg total) by mouth every 4 (four) hours as needed (for pain scale < 4)., Disp: 30 tablet, Rfl: 0   Blood Pressure Monitoring (BLOOD PRESSURE CUFF) MISC, 1 Units by Does not apply route once a week., Disp: 1 each, Rfl: 0   Continuous Glucose Sensor (DEXCOM G7 SENSOR) MISC, 1 Units by Does not apply route as directed. Change sensor every 10 days, Disp: 3 each, Rfl: 3   cyclobenzaprine  (FLEXERIL ) 10 MG tablet, Take 1 tablet (10 mg total) by mouth 3 (three) times daily as needed for muscle spasms., Disp: 30 tablet, Rfl: 0   FEROSUL 325 (65 Fe) MG tablet, Take 325 mg by mouth every other day., Disp: , Rfl:    Glucose Blood (BLOOD GLUCOSE TEST STRIPS) STRP, 1 each by In Vitro route in the morning, at noon, in the evening, and at bedtime. Accu-check brand or manufacturer covered by at&t. Use to check blood sugar fasting and 2 hours after meals or as needed, Disp: 200 strip, Rfl: 5   ibuprofen   (ADVIL ) 600 MG tablet, Take 1 tablet (600 mg total) by mouth every 6 (six) hours., Disp: 30 tablet, Rfl: 0   Misc. Devices (BREAST PUMP) MISC, Dispense one breast pump for patient, Disp: 1 each, Rfl: 0   Prenatal Vit-Fe Fumarate-FA (PRENATAL MULTIVITAMIN) TABS tablet, Take 1 tablet by mouth daily at 12 noon., Disp: , Rfl:    senna-docusate (SENOKOT-S) 8.6-50 MG tablet, Take 2 tablets by mouth daily., Disp: 30 tablet, Rfl: 0   Medications ordered in this encounter:  Meds ordered this encounter  Medications   valACYclovir  (VALTREX ) 1000 MG tablet    Sig: 2,000 mg (2 tablets) orally every 12 hours x 1 day    Dispense:  4 tablet    Refill:  0     *If you need refills on other medications prior to your next appointment, please contact your pharmacy*  Follow-Up: Call back or seek an in-person evaluation if the symptoms worsen or if the condition fails to improve as anticipated.  [facility] [phone]  Other Instructions Cold Sore  A cold sore, also called a fever blister, is a small, fluid-filled sore that forms inside of the mouth or on the lips, gums, nose, chin, or cheeks. Cold sores can spread to other parts of the body, such as the eyes, fingers, or genitals. Cold sores can spread from person to person (are contagious) until the sores crust over completely. Most cold sores go away  within 2 weeks. What are the causes? Cold sores are caused by a virus (herpes simplex virus type 1, HSV-1). The virus can spread from person to person through close contact, such as through: Kissing. Touching the affected area. Sharing personal items such as lip balm, razors, a drinking glass, or eating utensils. What increases the risk? Being tired, stressed, or sick. Having your period (menstruating). Being pregnant. Taking certain medicines. Being out in cold weather or getting too much sun. What are the signs or symptoms? Symptoms of a cold sore go through different  stages: Tingling, itching, or burning is felt 1-2 days before the cold sore appears. Fluid-filled blisters appear on the lips, inside the mouth, on the nose, or on the cheeks. The blisters start to ooze clear fluid. The blisters dry up, and a yellow crust appears in their place. The crust falls off. In some cases, other symptoms can develop along with cold sores. These can include: Fever. Sore throat. Headache. Muscle aches. Swollen neck glands. How is this treated? There is no cure for cold sores or the virus that causes them. There is also no vaccine to prevent the virus. Most cold sores go away on their own without treatment within 2 weeks. Your doctor may prescribe medicines to: Help with pain. Keep the virus from growing. Help you heal faster. Medicines may be in the form of creams, gels, pills, or a shot. Follow these instructions at home: Medicines Take or apply over-the-counter and prescription medicines only as told by your doctor. Use a cotton-tip swab to apply creams or gels to your sores. Ask your doctor if you can take lysine supplements. These may help with healing. Sore care  Do not touch the sores or pick the scabs. Wash your hands often with soap and water for at least 20 seconds. Do not touch your eyes without washing your hands first. Keep the sores clean and dry. If told, put ice on the sores. To do this: Put ice in a plastic bag. Place a towel between your skin and the bag. Leave the ice on for 20 minutes, 2-3 times a day. Take off the ice if your skin turns bright red. This is very important. If you cannot feel pain, heat, or cold, you have a greater risk of damage to the area. Eating and drinking Eat a soft, bland diet. Avoid eating hot, cold, or salty foods. These can hurt your mouth. Use a straw if it hurts to drink out of a glass. Eat foods that have a lot of lysine in them. These include meat, fish, and dairy products. Avoid sugary foods, chocolates,  nuts, and grains. These foods have a high amount of a substance (arginine) that can cause the virus to grow. Lifestyle Do not kiss, have oral sex, or share personal items until your sores heal. Stress, poor sleep, and being out in the sun can trigger a cold sore. Make sure you: Do activities that help you relax, such as deep breathing exercises or meditation. Get enough sleep. Put sunscreen on your lips before you go out in the sun. Contact a doctor if: You have symptoms for more than 2 weeks. You have pus coming from the sores. You have redness that is spreading. You have pain or irritation in your eye. You get sores on your genitals. Your sores do not heal within 2 weeks. You get cold sores often. Get help right away if: You have a fever and your symptoms suddenly get worse. You have a  headache and confusion. You have tiredness (fatigue). You do not want to eat as much as normal (loss of appetite). You have a stiff neck or are sensitive to light. Summary A cold sore is a small, fluid-filled sore that forms inside of the mouth or on the lips, gums, nose, chin, or cheeks. Cold sores can spread from person to person (are contagious) until the sores crust over completely. Most cold sores go away within 2 weeks. Wash your hands often. Do not touch your eyes without washing your hands first. Do not kiss, have oral sex, or share personal items until your sores heal. Contact a doctor if your sores do not heal within 2 weeks. This information is not intended to replace advice given to you by your health care provider. Make sure you discuss any questions you have with your health care provider. Document Revised: 04/23/2021 Document Reviewed: 04/23/2021 Elsevier Patient Education  2024 [employer]vier [employer].   If you have been instructed to have an in-person evaluation today at a local Urgent Care facility, please use the link below. It will take you to a list of all of our available [facility], including address, phone number and hours of operation. Please do not delay care.  [facility]  If you or a family member do not have a primary care provider, use the link below to schedule a visit and establish care. When you choose a [facility] primary care physician or advanced practice provider, you gain a long-term partner in health. Find a Primary Care Provider  Learn more about [facility]'s in-office and virtual care options: [facility] - Get Care Now

## 2024-07-08 NOTE — Progress Notes (Signed)
 Virtual Visit Consent   Whitney Hickman, you are scheduled for a virtual visit with a [facility] provider today. Just as with appointments in the office, your consent must be obtained to participate. Your consent will be active for this visit and any virtual visit you may have with one of our providers in the next 365 days. If you have a MyChart account, a copy of this consent can be sent to you electronically.  As this is a virtual visit, video technology does not allow for your provider to perform a traditional examination. This may limit your provider's ability to fully assess your condition. If your provider identifies any concerns that need to be evaluated in person or the need to arrange testing (such as labs, EKG, etc.), we will make arrangements to do so. Although advances in technology are sophisticated, we cannot ensure that it will always work on either your end or our end. If the connection with a video visit is poor, the visit may have to be switched to a telephone visit. With either a video or telephone visit, we are not always able to ensure that we have a secure connection.  By engaging in this virtual visit, you consent to the provision of healthcare and authorize for your insurance to be billed (if applicable) for the services provided during this visit. Depending on your insurance coverage, you may receive a charge related to this service.  I need to obtain your verbal consent now. Are you willing to proceed with your visit today? Whitney Hickman has provided verbal consent on 07/08/2024 for a virtual visit (video or telephone). Whitney Hickman, [place]  Date: 07/08/2024 2:36 PM   Virtual Visit via Video Note   I, Whitney Hickman, connected with  Whitney Hickman  ([id number], [birth year]) on 07/08/2024 at  2:30 PM EST by a video-enabled telemedicine application and verified that I am speaking with the correct person using two identifiers.  Location: Patient: Virtual Visit Location Patient:  Home Provider: Virtual Visit Location Provider: Home Office   I discussed the limitations of evaluation and management by telemedicine and the availability of in person appointments. The patient expressed understanding and agreed to proceed.    History of Present Illness: Whitney Hickman is a 23 y.o. who identifies as a female who was assigned female at birth, and is being seen today for c/o having something big on her lip that arrived last night.  Pt states she feels like something keep coming out of it and feels a little numb.  Pt states she has had cold sores before on the side of her lip.   HPI: HPI  Problems:  Patient Active Problem List   Diagnosis Date Noted   History of gestational diabetes 12/23/2023   Alpha thalassemia silent carrier 12/15/2023   History of poor fetal growth 12/15/2023   History of ectopic pregnancy 07/15/2021   [facility] care (status) 06/02/2018    Allergies: Allergies[1] Medications: Current Medications[2]  Observations/Objective: Patient is well-developed, well-nourished in no acute distress.  Resting comfortably at home.  Head is normocephalic, atraumatic.  No labored breathing.  Speech is clear and coherent with logical content.  Patient is alert and oriented at baseline.    Assessment and Plan: 1. Herpes labialis (Primary) - valACYclovir  (VALTREX ) 1000 MG tablet; 2,000 mg (2 tablets) orally every 12 hours x 1 day  Dispense: 4 tablet; Refill: 0  -Start Valacyclovir   -Pt to F/U with PCP for persistent or worsening symptoms  Follow Up Instructions: I discussed the  assessment and treatment plan with the patient. The patient was provided an opportunity to ask questions and all were answered. The patient agreed with the plan and demonstrated an understanding of the instructions.  A copy of instructions were sent to the patient via MyChart unless otherwise noted below.    The patient was advised to call back or seek an in-person evaluation if the symptoms  worsen or if the condition fails to improve as anticipated.    Whitney Mater, PA-C    [1] No Known Allergies [2]  Current Outpatient Medications:    valACYclovir  (VALTREX ) 1000 MG tablet, 2,000 mg (2 tablets) orally every 12 hours x 1 day, Disp: 4 tablet, Rfl: 0   Accu-Chek Softclix Lancets lancets, Use four times daily to check blood sugar fasting and 2 hours after meals or as needed, Disp: 200 each, Rfl: 5   acetaminophen  (TYLENOL ) 325 MG tablet, Take 2 tablets (650 mg total) by mouth every 4 (four) hours as needed (for pain scale < 4)., Disp: 30 tablet, Rfl: 0   Blood Pressure Monitoring (BLOOD PRESSURE CUFF) MISC, 1 Units by Does not apply route once a week., Disp: 1 each, Rfl: 0   Continuous Glucose Sensor (DEXCOM G7 SENSOR) MISC, 1 Units by Does not apply route as directed. Change sensor every 10 days, Disp: 3 each, Rfl: 3   cyclobenzaprine  (FLEXERIL ) 10 MG tablet, Take 1 tablet (10 mg total) by mouth 3 (three) times daily as needed for muscle spasms., Disp: 30 tablet, Rfl: 0   FEROSUL 325 (65 Fe) MG tablet, Take 325 mg by mouth every other day., Disp: , Rfl:    Glucose Blood (BLOOD GLUCOSE TEST STRIPS) STRP, 1 each by In Vitro route in the morning, at noon, in the evening, and at bedtime. Accu-check brand or manufacturer covered by [employer]. Use to check blood sugar fasting and 2 hours after meals or as needed, Disp: 200 strip, Rfl: 5   ibuprofen  (ADVIL ) 600 MG tablet, Take 1 tablet (600 mg total) by mouth every 6 (six) hours., Disp: 30 tablet, Rfl: 0   Misc. Devices (BREAST PUMP) MISC, Dispense one breast pump for patient, Disp: 1 each, Rfl: 0   Prenatal Vit-Fe Fumarate-FA (PRENATAL MULTIVITAMIN) TABS tablet, Take 1 tablet by mouth daily at 12 noon., Disp: , Rfl:    senna-docusate (SENOKOT-S) 8.6-50 MG tablet, Take 2 tablets by mouth daily., Disp: 30 tablet, Rfl: 0

## 2024-07-24 ENCOUNTER — Encounter
